# Patient Record
Sex: Female | Born: 1935 | Race: Black or African American | Hispanic: No | Smoking: Never smoker
Health system: Southern US, Community
[De-identification: ages and names within clinical notes are randomized; demographics above are authoritative.]

## PROBLEM LIST (undated history)

## (undated) DIAGNOSIS — S81809A Unspecified open wound, unspecified lower leg, initial encounter: Secondary | ICD-10-CM

## (undated) DIAGNOSIS — F0390 Unspecified dementia without behavioral disturbance: Secondary | ICD-10-CM

## (undated) DIAGNOSIS — K219 Gastro-esophageal reflux disease without esophagitis: Secondary | ICD-10-CM

---

## 2009-05-28 ENCOUNTER — Ambulatory Visit: Payer: Self-pay | Admitting: Internal Medicine

## 2011-01-19 ENCOUNTER — Encounter: Payer: Self-pay | Admitting: Nurse Practitioner

## 2011-01-19 ENCOUNTER — Encounter: Payer: Self-pay | Admitting: Cardiothoracic Surgery

## 2011-02-03 ENCOUNTER — Encounter: Payer: Self-pay | Admitting: Cardiothoracic Surgery

## 2011-02-03 ENCOUNTER — Encounter: Payer: Self-pay | Admitting: Nurse Practitioner

## 2011-03-06 ENCOUNTER — Encounter: Payer: Self-pay | Admitting: Nurse Practitioner

## 2011-03-06 ENCOUNTER — Encounter: Payer: Self-pay | Admitting: Cardiothoracic Surgery

## 2011-04-06 ENCOUNTER — Encounter: Payer: Self-pay | Admitting: Nurse Practitioner

## 2011-04-06 ENCOUNTER — Encounter: Payer: Self-pay | Admitting: Cardiothoracic Surgery

## 2011-05-04 ENCOUNTER — Encounter: Payer: Self-pay | Admitting: Nurse Practitioner

## 2011-05-04 ENCOUNTER — Encounter: Payer: Self-pay | Admitting: Cardiothoracic Surgery

## 2011-05-17 LAB — WOUND CULTURE

## 2011-06-04 ENCOUNTER — Encounter: Payer: Self-pay | Admitting: Cardiothoracic Surgery

## 2011-06-04 ENCOUNTER — Encounter: Payer: Self-pay | Admitting: Nurse Practitioner

## 2011-07-04 ENCOUNTER — Encounter: Payer: Self-pay | Admitting: Cardiothoracic Surgery

## 2011-07-04 ENCOUNTER — Encounter: Payer: Self-pay | Admitting: Nurse Practitioner

## 2011-08-04 ENCOUNTER — Encounter: Payer: Self-pay | Admitting: Cardiothoracic Surgery

## 2011-08-04 ENCOUNTER — Encounter: Payer: Self-pay | Admitting: Nurse Practitioner

## 2011-09-03 ENCOUNTER — Encounter: Payer: Self-pay | Admitting: Cardiothoracic Surgery

## 2011-09-03 ENCOUNTER — Encounter: Payer: Self-pay | Admitting: Nurse Practitioner

## 2011-09-23 LAB — WOUND CULTURE

## 2011-10-04 ENCOUNTER — Encounter: Payer: Self-pay | Admitting: Cardiothoracic Surgery

## 2011-10-04 ENCOUNTER — Encounter: Payer: Self-pay | Admitting: Nurse Practitioner

## 2012-04-24 ENCOUNTER — Encounter: Payer: Self-pay | Admitting: Cardiothoracic Surgery

## 2012-04-24 ENCOUNTER — Encounter: Payer: Self-pay | Admitting: Nurse Practitioner

## 2012-05-03 ENCOUNTER — Encounter: Payer: Self-pay | Admitting: Cardiothoracic Surgery

## 2012-05-03 ENCOUNTER — Encounter: Payer: Self-pay | Admitting: Nurse Practitioner

## 2012-05-11 LAB — WOUND CULTURE

## 2012-06-03 ENCOUNTER — Encounter: Payer: Self-pay | Admitting: Nurse Practitioner

## 2012-06-03 ENCOUNTER — Encounter: Payer: Self-pay | Admitting: Cardiothoracic Surgery

## 2012-06-29 LAB — WOUND CULTURE

## 2012-07-03 ENCOUNTER — Encounter: Payer: Self-pay | Admitting: Cardiothoracic Surgery

## 2012-07-03 ENCOUNTER — Encounter: Payer: Self-pay | Admitting: Nurse Practitioner

## 2012-08-03 ENCOUNTER — Encounter: Payer: Self-pay | Admitting: Cardiothoracic Surgery

## 2012-08-03 ENCOUNTER — Encounter: Payer: Self-pay | Admitting: Nurse Practitioner

## 2012-09-02 ENCOUNTER — Encounter: Payer: Self-pay | Admitting: Nurse Practitioner

## 2012-09-02 ENCOUNTER — Encounter: Payer: Self-pay | Admitting: Cardiothoracic Surgery

## 2012-10-03 ENCOUNTER — Encounter: Payer: Self-pay | Admitting: Nurse Practitioner

## 2012-10-03 ENCOUNTER — Encounter: Payer: Self-pay | Admitting: Cardiothoracic Surgery

## 2013-04-05 ENCOUNTER — Ambulatory Visit
Admit: 2013-04-05 | Disposition: A | Discharge status: Home / Self Care | Payer: Self-pay | Attending: Nurse Practitioner | Admitting: Nurse Practitioner

## 2013-07-03 ENCOUNTER — Ambulatory Visit: Payer: Self-pay | Admitting: Nurse Practitioner

## 2013-08-03 ENCOUNTER — Ambulatory Visit
Admit: 2013-08-03 | Disposition: A | Discharge status: Home / Self Care | Payer: Self-pay | Attending: Nurse Practitioner | Admitting: Nurse Practitioner

## 2013-09-02 ENCOUNTER — Ambulatory Visit
Admit: 2013-09-02 | Disposition: A | Discharge status: Home / Self Care | Payer: Self-pay | Attending: Nurse Practitioner | Admitting: Nurse Practitioner

## 2014-03-05 ENCOUNTER — Ambulatory Visit
Admit: 2014-03-05 | Disposition: A | Discharge status: Home / Self Care | Payer: Self-pay | Attending: Nurse Practitioner | Admitting: Nurse Practitioner

## 2014-10-14 ENCOUNTER — Encounter: Payer: Self-pay | Admitting: Internal Medicine

## 2014-10-14 ENCOUNTER — Inpatient Hospital Stay
Admission: EM | Admit: 2014-10-14 | Discharge: 2014-10-15 | DRG: 534 | Disposition: A | Discharge status: Skilled Nursing Facility | Payer: Medicare Other | Attending: Internal Medicine | Admitting: Internal Medicine

## 2014-10-14 ENCOUNTER — Emergency Department: Payer: Medicare Other

## 2014-10-14 DIAGNOSIS — X58XXXA Exposure to other specified factors, initial encounter: Secondary | ICD-10-CM | POA: Diagnosis present

## 2014-10-14 DIAGNOSIS — Z79899 Other long term (current) drug therapy: Secondary | ICD-10-CM | POA: Diagnosis not present

## 2014-10-14 DIAGNOSIS — S7292XA Unspecified fracture of left femur, initial encounter for closed fracture: Secondary | ICD-10-CM | POA: Diagnosis present

## 2014-10-14 DIAGNOSIS — K219 Gastro-esophageal reflux disease without esophagitis: Secondary | ICD-10-CM | POA: Diagnosis present

## 2014-10-14 DIAGNOSIS — S72452A Displaced supracondylar fracture without intracondylar extension of lower end of left femur, initial encounter for closed fracture: Secondary | ICD-10-CM | POA: Diagnosis present

## 2014-10-14 DIAGNOSIS — R739 Hyperglycemia, unspecified: Secondary | ICD-10-CM | POA: Diagnosis present

## 2014-10-14 DIAGNOSIS — Z8249 Family history of ischemic heart disease and other diseases of the circulatory system: Secondary | ICD-10-CM | POA: Diagnosis not present

## 2014-10-14 DIAGNOSIS — D72829 Elevated white blood cell count, unspecified: Secondary | ICD-10-CM | POA: Diagnosis present

## 2014-10-14 DIAGNOSIS — J986 Disorders of diaphragm: Secondary | ICD-10-CM | POA: Diagnosis present

## 2014-10-14 DIAGNOSIS — Z66 Do not resuscitate: Secondary | ICD-10-CM | POA: Diagnosis present

## 2014-10-14 DIAGNOSIS — Z888 Allergy status to other drugs, medicaments and biological substances status: Secondary | ICD-10-CM

## 2014-10-14 DIAGNOSIS — Z79891 Long term (current) use of opiate analgesic: Secondary | ICD-10-CM

## 2014-10-14 DIAGNOSIS — D649 Anemia, unspecified: Secondary | ICD-10-CM | POA: Diagnosis present

## 2014-10-14 DIAGNOSIS — F0390 Unspecified dementia without behavioral disturbance: Secondary | ICD-10-CM | POA: Diagnosis present

## 2014-10-14 DIAGNOSIS — R740 Nonspecific elevation of levels of transaminase and lactic acid dehydrogenase [LDH]: Secondary | ICD-10-CM | POA: Diagnosis present

## 2014-10-14 DIAGNOSIS — R7401 Elevation of levels of liver transaminase levels: Secondary | ICD-10-CM

## 2014-10-14 HISTORY — DX: Unspecified open wound, unspecified lower leg, initial encounter: S81.809A

## 2014-10-14 HISTORY — DX: Unspecified dementia, unspecified severity, without behavioral disturbance, psychotic disturbance, mood disturbance, and anxiety: F03.90

## 2014-10-14 HISTORY — DX: Gastro-esophageal reflux disease without esophagitis: K21.9

## 2014-10-14 LAB — CBC WITH DIFFERENTIAL/PLATELET
BASOS ABS: 0 10*3/uL (ref 0–0.1)
BASOS PCT: 0 %
EOS PCT: 1 %
Eosinophils Absolute: 0.1 10*3/uL (ref 0–0.7)
HCT: 34.2 % — ABNORMAL LOW (ref 35.0–47.0)
HEMOGLOBIN: 10.8 g/dL — AB (ref 12.0–16.0)
Lymphocytes Relative: 34 %
Lymphs Abs: 3.8 10*3/uL — ABNORMAL HIGH (ref 1.0–3.6)
MCH: 27.9 pg (ref 26.0–34.0)
MCHC: 31.7 g/dL — AB (ref 32.0–36.0)
MCV: 87.9 fL (ref 80.0–100.0)
MONO ABS: 0.6 10*3/uL (ref 0.2–0.9)
MONOS PCT: 5 %
Neutro Abs: 6.8 10*3/uL — ABNORMAL HIGH (ref 1.4–6.5)
Neutrophils Relative %: 60 %
Platelets: 358 10*3/uL (ref 150–440)
RBC: 3.89 MIL/uL (ref 3.80–5.20)
RDW: 14.4 % (ref 11.5–14.5)
WBC: 11.3 10*3/uL — ABNORMAL HIGH (ref 3.6–11.0)

## 2014-10-14 LAB — COMPREHENSIVE METABOLIC PANEL
ALK PHOS: 105 U/L (ref 38–126)
ALT: 62 U/L — ABNORMAL HIGH (ref 14–54)
AST: 63 U/L — AB (ref 15–41)
Albumin: 3.2 g/dL — ABNORMAL LOW (ref 3.5–5.0)
Anion gap: 7 (ref 5–15)
BUN: 28 mg/dL — AB (ref 6–20)
CHLORIDE: 102 mmol/L (ref 101–111)
CO2: 28 mmol/L (ref 22–32)
Calcium: 8.8 mg/dL — ABNORMAL LOW (ref 8.9–10.3)
Creatinine, Ser: 0.69 mg/dL (ref 0.44–1.00)
GFR calc Af Amer: >60 mL/min (ref >60)
GFR calc non Af Amer: >60 mL/min (ref >60)
Glucose, Bld: 186 mg/dL — ABNORMAL HIGH (ref 65–99)
POTASSIUM: 4.5 mmol/L (ref 3.5–5.1)
Sodium: 137 mmol/L (ref 135–145)
TOTAL PROTEIN: 6.7 g/dL (ref 6.5–8.1)
Total Bilirubin: 0.7 mg/dL (ref 0.3–1.2)

## 2014-10-14 MED ORDER — OXYCODONE HCL 5 MG PO TABS: 5.0000 mg | ORAL_TABLET | ORAL | Status: DC | PRN

## 2014-10-14 MED ORDER — MORPHINE SULFATE 4 MG/ML IJ SOLN
4.0000 mg | Freq: Once | INTRAMUSCULAR | Status: AC
  Administered 2014-10-14: 4 mg via INTRAVENOUS
  Filled 2014-10-14: qty 1

## 2014-10-14 MED ORDER — POLYETHYLENE GLYCOL 3350 17 G PO PACK
17.0000 g | PACK | Freq: Every day | ORAL | Status: DC
  Administered 2014-10-15: 17 g via ORAL
  Filled 2014-10-14: qty 1

## 2014-10-14 MED ORDER — ACETAMINOPHEN 650 MG RE SUPP: 650.0000 mg | Freq: Four times a day (QID) | RECTAL | Status: DC | PRN

## 2014-10-14 MED ORDER — HYDROCERIN EX CREA
1.0000 "application " | TOPICAL_CREAM | Freq: Every evening | CUTANEOUS | Status: DC
  Filled 2014-10-14: qty 113

## 2014-10-14 MED ORDER — DIVALPROEX SODIUM 125 MG PO CSDR
125.0000 mg | DELAYED_RELEASE_CAPSULE | Freq: Every day | ORAL | Status: DC
  Administered 2014-10-14: 125 mg via ORAL
  Filled 2014-10-14: qty 1

## 2014-10-14 MED ORDER — IPRATROPIUM-ALBUTEROL 0.5-2.5 (3) MG/3ML IN SOLN
3.0000 mL | Freq: Four times a day (QID) | RESPIRATORY_TRACT | Status: DC
  Administered 2014-10-14 – 2014-10-15 (×4): 3 mL via RESPIRATORY_TRACT
  Filled 2014-10-14 (×4): qty 3

## 2014-10-14 MED ORDER — DOCUSATE SODIUM 100 MG PO CAPS
100.0000 mg | ORAL_CAPSULE | Freq: Two times a day (BID) | ORAL | Status: DC
  Administered 2014-10-14 – 2014-10-15 (×2): 100 mg via ORAL
  Filled 2014-10-14 (×2): qty 1

## 2014-10-14 MED ORDER — PANTOPRAZOLE SODIUM 40 MG PO TBEC
40.0000 mg | DELAYED_RELEASE_TABLET | Freq: Every day | ORAL | Status: DC
  Administered 2014-10-15: 40 mg via ORAL
  Filled 2014-10-14: qty 1

## 2014-10-14 MED ORDER — GABAPENTIN 300 MG PO CAPS
300.0000 mg | ORAL_CAPSULE | Freq: Every day | ORAL | Status: DC
  Administered 2014-10-14: 300 mg via ORAL
  Filled 2014-10-14: qty 1

## 2014-10-14 MED ORDER — ENOXAPARIN SODIUM 40 MG/0.4ML ~~LOC~~ SOLN: 40.0000 mg | SUBCUTANEOUS | Status: DC

## 2014-10-14 MED ORDER — ACETAMINOPHEN 325 MG PO TABS
650.0000 mg | ORAL_TABLET | Freq: Four times a day (QID) | ORAL | Status: DC | PRN
  Administered 2014-10-14 – 2014-10-15 (×3): 650 mg via ORAL
  Filled 2014-10-14 (×3): qty 2

## 2014-10-14 NOTE — ED Provider Notes (Signed)
[facility] Emergency Department Provider Note     Time seen: ----------------------------------------- 2:41 PM on 10/14/2014 -----------------------------------------    I have reviewed the triage vital signs and the nursing notes. L5 caveat: Review of systems and history is difficult to obtain due to dementia  HISTORY  Chief Complaint Hip Injury    HPI DIMA MINI is a 81 y.o. female brought in by EMS from [facility] for possible distal femur fracture. She reportedly had an outpatient x-ray which showed a distal femur fracture, nursing home states she has not fallen, there was no trauma. Patient denies falling, just complains of lower leg pain. According to reports she does not ambulate.   No past medical history on file.  There are no active problems to display for this patient.   No past surgical history on file.  Allergies Diamox sequels  Social History Social History  Substance Use Topics  . Smoking status: Not on file  . Smokeless tobacco: Not on file  . Alcohol Use: Not on file    Review of Systems Musculoskeletal: Positive for left leg pain  Review of systems otherwise unknown  ____________________________________________   PHYSICAL EXAM:  VITAL SIGNS: ED Triage Vitals  Enc Vitals Group     BP 10/14/14 1429 140/85 mmHg     Pulse Rate 10/14/14 1429 95     Resp --      Temp --      Temp src --      SpO2 10/14/14 1429 95 %     Weight 10/14/14 1429 150 lb (68.04 kg)     Height 10/14/14 1429 5\' 6"  (1.676 m)     Head Cir --      Peak Flow --      Pain Score --      Pain Loc --      Pain Edu? --      Excl. in GC? --     Constitutional: Alert but disoriented, she appears to be uncomfortable Eyes: Conjunctivae are normal. PERRL. Normal extraocular movements. ENT   Head: Normocephalic and atraumatic.   Nose: No congestion/rhinnorhea.   Mouth/Throat: Mucous membranes are moist.   Neck: No  stridor. Cardiovascular: Normal rate, regular rhythm. Normal and symmetric distal pulses are present in all extremities. Systolic murmur Respiratory: Normal respiratory effort without tachypnea nor retractions. Breath sounds are clear and equal bilaterally. No wheezes/rales/rhonchi. Gastrointestinal: Soft and nontender. No distention. No abdominal bruits. Bowel sounds radiating into the chest, likely hiatal hernia Musculoskeletal: Severe pain with any range of motion of left lower extremity, distal pulses are intact Skin:  Skin is warm, dry and intact. No rash noted. ____________________________________________  ED COURSE:  Pertinent labs & imaging results that were available during my care of the patient were reviewed by me and considered in my medical decision making (see chart for details). Will need reimaging of the left femur, basic labs. ____________________________________________    LABS (pertinent positives/negatives)  Labs Reviewed  CBC WITH DIFFERENTIAL/PLATELET  COMPREHENSIVE METABOLIC PANEL    RADIOLOGY Images were viewed by me  Left femur x-ray reveals a comminuted 100% displaced distal femur fracture IMPRESSION: 1. Severely comminuted, displaced and foreshortened fracture of the distal third of the femoral diaphysis. 2. Mildly impacted supracondylar fracture of the distal femur, with potential intra-articular extension. 3. Unusual lucencies over the proximal femoral diaphysis and intertrochanteric region, favored to be artifactual. 4. Given the findings in numbers 1-3 above, further evaluation with CT of the entire left femur  is suggested at this time to ensure that there is a full depiction of the extent of trauma to the left femur. ____________________________________________  FINAL ASSESSMENT AND PLAN  Distal femur fracture  Plan: Patient with labs and imaging as dictated above. Patient is nonambulatory, however will need orthopedics and pain control. Dr.  Rosita Kea has seen the patient in the ER. Case is discussed with the hospitalist.   Emily Filbert, MD   Emily Filbert, MD 10/14/14 1600

## 2014-10-14 NOTE — ED Notes (Signed)
Pt has dnr from outside facility signed by dr. Maryellen Pile but rest of document not completed.

## 2014-10-14 NOTE — ED Notes (Signed)
Per nursing home the 3rd shift nurse from last night noted increased swelling and pain in pt's left leg. No injury per staff - pt is nonambulatory and requires a max assist into a chair. Left leg/knee swollen and pt requested I not touch it. States that is the only time it hurts.

## 2014-10-14 NOTE — ED Notes (Signed)
Assisted ortho dr into placing a knee immobilizer on pt

## 2014-10-14 NOTE — ED Notes (Signed)
Pt received roxicet 1 tab at 1pm for pain - currently denies pain.

## 2014-10-14 NOTE — ED Notes (Signed)
Pt currently denies pain - let her know she had pain meds if she needed

## 2014-10-14 NOTE — ED Notes (Signed)
Brought in by ems from  health care for left distal femur fx diagnosed thru xr today. Call out to nh to find out details

## 2014-10-14 NOTE — H&P (Signed)
[facility] [place] [facility]   PATIENT NAME: Edit Christina Macdonald    MR#:  [id number]  DATE OF BIRTH:  Oct 06, 1924  DATE OF ADMISSION:  10/14/2014  PRIMARY CARE PHYSICIAN: Derwood Kaplan, MD   REQUESTING/REFERRING PHYSICIAN: Cherene Julian M.D.  CHIEF COMPLAINT:   Chief Complaint  Patient presents with  . Hip Injury    left distal femur fx    HISTORY OF PRESENT ILLNESS:  Christina Macdonald  is a 81 y.o. female with a known history of dementia. She is currently at [facility]. The patient has dementia, so she is not a good historian.  The patient cannot recall any trauma to the leg or any falls. As per the son she was complaining of some pain to him about 2 days ago. As per the family the facility did not report to them of any falls or any trauma. The son noticed that oxygen was on for a few days for shortness of breath. The patient has not had any attempt to walk in about 6 months with her dementia and knee problems. She was found to have a left distal femur fracture and hospitalist services were contacted for further evaluation.  PAST MEDICAL HISTORY:   Past Medical History  Diagnosis Date  . Dementia   . GERD (gastroesophageal reflux disease)   . Wounds, multiple open, lower extremity     PAST SURGICAL HISTORY:  History reviewed. No pertinent past surgical history.  SOCIAL HISTORY:   Social History  Substance Use Topics  . Smoking status: Never Smoker   . Smokeless tobacco: Not on file  . Alcohol Use: No    FAMILY HISTORY:   Family History  Problem Relation Age of Onset  . CAD Father     DRUG ALLERGIES:   Allergies  Allergen Reactions  . Diamox Sequels [Acetazolamide Er] Other (See Comments)    Reaction:  Unknown     REVIEW OF SYSTEMS:  CONSTITUTIONAL: No fever, positive for weakness.  EYES: No blurred or double vision. Wears glasses. EARS, NOSE, AND THROAT: No tinnitus or ear pain. Occasional sore throat. RESPIRATORY:  No cough, positive for shortness of breath, no wheezing or hemoptysis.  CARDIOVASCULAR: No chest pain, orthopnea, edema.  GASTROINTESTINAL: No nausea, vomiting, or diarrhea. Occasional abdominal pain. No blood in bowel movements. GENITOURINARY: No dysuria, hematuria.  ENDOCRINE: No polyuria, nocturia,  HEMATOLOGY: No anemia, easy bruising or bleeding SKIN: No rash or lesion. MUSCULOSKELETAL: Left leg pain and bilateral knee pain  NEUROLOGIC: No tingling, numbness, weakness.  PSYCHIATRY: No anxiety or depression.   MEDICATIONS AT HOME:   Prior to Admission medications   Medication Sig Start Date End Date Taking? Authorizing Provider  acetaminophen (TYLENOL) 500 MG tablet Take 1,000 mg by mouth 3 (three) times daily.   Yes Historical Provider, MD  cholecalciferol (VITAMIN D) 1000 UNITS tablet Take 1,000 Units by mouth daily.   Yes Historical Provider, MD  divalproex (DEPAKOTE SPRINKLE) 125 MG capsule Take 125 mg by mouth at bedtime.   Yes Historical Provider, MD  docusate sodium (COLACE) 100 MG capsule Take 100 mg by mouth 2 (two) times daily.   Yes Historical Provider, MD  gabapentin (NEURONTIN) 300 MG capsule Take 300 mg by mouth at bedtime.   Yes Historical Provider, MD  hydrophilic ointment Apply 1 application topically every evening. Pt applies to feet.   Yes Historical Provider, MD  ipratropium-albuterol (DUONEB) 0.5-2.5 (3) MG/3ML SOLN Take 3 mLs by nebulization 4 (four) times daily.  Yes Historical Provider, MD  omeprazole (PRILOSEC) 20 MG capsule Take 20 mg by mouth daily.   Yes Historical Provider, MD  oxyCODONE (OXY IR/ROXICODONE) 5 MG immediate release tablet Take 5 mg by mouth every 4 (four) hours as needed for severe pain.   Yes Historical Provider, MD  polyethylene glycol (MIRALAX / GLYCOLAX) packet Take 17 g by mouth daily.   Yes Historical Provider, MD      VITAL SIGNS:  Blood pressure 135/80, pulse 81, temperature 98.1 F (36.7 C), temperature source Oral, resp. rate  16, height  (1.676 m), weight 68.04 kg (150 lb), SpO2 98 %.  PHYSICAL EXAMINATION:  GENERAL:  81 y.o.-year-old patient lying in the bed with no acute distress.  EYES: Pupils equal, round, reactive to light and accommodation. No scleral icterus. Extraocular muscles intact.  HEENT: Head atraumatic, normocephalic. Oropharynx and nasopharynx clear.  NECK:  Supple, no jugular venous distention. No thyroid enlargement, no tenderness.  LUNGS: Normal breath sounds bilaterally, no wheezing, rales,rhonchi or crepitation. No use of accessory muscles of respiration.  CARDIOVASCULAR: S1, S2 normal. 26 systolic murmur, no rubs, or gallops.  ABDOMEN: Soft, nontender, nondistended. Bowel sounds present. No organomegaly or mass.  EXTREMITIES: Trace edema, no cyanosis, or clubbing. Left leg currently in immobilizer. NEUROLOGIC: Difficult to test cranial nerves with history of dementia. Extraocular muscles are intact. Patient able to move her arms on her own. Patient is able to move her left foot. PSYCHIATRIC: The patient is alert. SKIN: Bilateral feet has areas of healing wounds on the right lateral ankle and left medial foot. No signs of infection  LABORATORY PANEL:   CBC  Recent Labs Lab 10/14/14 1600  WBC 11.3*  HGB 10.8*  HCT 34.2*  PLT 358    Chemistries   Recent Labs Lab 10/14/14 1600  NA 137  K 4.5  CL 102  CO2 28  GLUCOSE 186*  BUN 28*  CREATININE 0.69  CALCIUM 8.8*  AST 63*  ALT 62*  ALKPHOS 105  BILITOT 0.7     RADIOLOGY:  Dg Chest 1 View  10/14/2014   CLINICAL DATA:  Leg pain after fall.  EXAM: CHEST  1 VIEW  COMPARISON:  None.  FINDINGS: The heart size and mediastinal contours are within normal limits. No pneumothorax or pleural effusion is noted. Severely elevated right hemidiaphragm is noted. Both lungs are clear. Degenerative change of right glenohumeral joint is noted.  IMPRESSION: Severely elevated right hemidiaphragm is noted concerning for diaphragmatic  paralysis. No other abnormality seen.   Electronically Signed   By: Lupita Raider, M.D.   On: 10/14/2014 15:28   Dg Femur Min 2 Views Left  10/14/2014   CLINICAL DATA:  81 year old female with history of trauma from a fall today complaining of left leg pain distally.  EXAM: LEFT FEMUR 2 VIEWS  COMPARISON:  No priors.  FINDINGS: Severely comminuted displaced and foreshortened fracture of the distal third of the femoral diaphysis is noted. There is approximately 1/2 shaft width of posterior displacement, and several cm of proximal migration of the distal fracture fragment. In addition, there is a poorly demonstrated supracondylar fracture, which appears mildly impacted, and there is a suggestion that this may extend to the joint space in the intercondylar notch. Advanced degenerative changes of osteoarthritis are noted in the knee joint. Although no definite fracture of the proximal left femur is identified, there are some unusual lucencies overlying the proximal diaphysis and intertrochanteric region, which are favored to be related to overlying  skin folds. Femoral head appears located.  IMPRESSION: 1. Severely comminuted, displaced and foreshortened fracture of the distal third of the femoral diaphysis. 2. Mildly impacted supracondylar fracture of the distal femur, with potential intra-articular extension. 3. Unusual lucencies over the proximal femoral diaphysis and intertrochanteric region, favored to be artifactual. 4. Given the findings in numbers 1-3 above, further evaluation with CT of the entire left femur is suggested at this time to ensure that there is a full depiction of the extent of trauma to the left femur.   Electronically Signed   By: Trudie Reed M.D.   On: 10/14/2014 15:35    EKG:   Normal sinus rhythm 82 bpm incomplete right bundle branch block, left anterior fascicular block, nonspecific ST-T wave changes  IMPRESSION AND PLAN:   1. Distal left femur fracture closed with  malalignment. Orthopedic surgery consultation. If surgery decided on the patient is a moderate risk. They may decide on conservative management and pain control. Continue oxycodone when necessary pain 2 dementia without behavioral disturbance- continue usual psychiatric medications. 3. Lower extremity healing wounds 4. Gastroesophageal reflux disease without esophagitis continue PPI  All the records are reviewed and case discussed with ED provider. Management plans discussed with the patient, family and they are in agreement.  CODE STATUS: DO NOT RESUSCITATE  TOTAL TIME TAKING CARE OF THIS PATIENT: 55 minutes.    Alford Highland M.D on 10/14/2014 at 5:23 PM  Between 7am to 6pm - Pager - [phone]  After 6pm call admission pager [phone]  [facility]  Office  [phone]  CC: Primary care physician; Derwood Kaplan, MD

## 2014-10-14 NOTE — Consult Note (Addendum)
Comminuted distal femur in nonambulatory confused woman.. Discussed with son.  Recommend trial of non operative treatment  On exam patient is noted to have plantar flexion deformities of both feet consistent with her inability to ambulate. There is some crepitation with application of the knee immobilizer with moderate pain, but after discussion with the son he understands that operative intervention would be fairly risky and there is significant chance that we may not have adequate fixation with her poor bone quality and couldn't of having loss of fixation requiring additional surgery that is not likely to benefit her much. Can be managed adequately with the immobilizer stay in appropriate position hope that she will heal the current position

## 2014-10-15 DIAGNOSIS — R7401 Elevation of levels of liver transaminase levels: Secondary | ICD-10-CM

## 2014-10-15 DIAGNOSIS — D72829 Elevated white blood cell count, unspecified: Secondary | ICD-10-CM

## 2014-10-15 DIAGNOSIS — D649 Anemia, unspecified: Secondary | ICD-10-CM

## 2014-10-15 DIAGNOSIS — R739 Hyperglycemia, unspecified: Secondary | ICD-10-CM

## 2014-10-15 DIAGNOSIS — R740 Nonspecific elevation of levels of transaminase and lactic acid dehydrogenase [LDH]: Secondary | ICD-10-CM

## 2014-10-15 DIAGNOSIS — J986 Disorders of diaphragm: Secondary | ICD-10-CM

## 2014-10-15 LAB — BASIC METABOLIC PANEL
Anion gap: 5 (ref 5–15)
BUN: 23 mg/dL — AB (ref 6–20)
CALCIUM: 8.5 mg/dL — AB (ref 8.9–10.3)
CO2: 28 mmol/L (ref 22–32)
Chloride: 107 mmol/L (ref 101–111)
Creatinine, Ser: 0.61 mg/dL (ref 0.44–1.00)
GFR calc Af Amer: >60 mL/min (ref >60)
GFR calc non Af Amer: >60 mL/min (ref >60)
GLUCOSE: 92 mg/dL (ref 65–99)
Potassium: 4.7 mmol/L (ref 3.5–5.1)
Sodium: 140 mmol/L (ref 135–145)

## 2014-10-15 LAB — CBC
HEMATOCRIT: 31.4 % — AB (ref 35.0–47.0)
Hemoglobin: 10.1 g/dL — ABNORMAL LOW (ref 12.0–16.0)
MCH: 27.9 pg (ref 26.0–34.0)
MCHC: 32.1 g/dL (ref 32.0–36.0)
MCV: 87 fL (ref 80.0–100.0)
Platelets: 327 10*3/uL (ref 150–440)
RBC: 3.6 MIL/uL — ABNORMAL LOW (ref 3.80–5.20)
RDW: 14.3 % (ref 11.5–14.5)
WBC: 10 10*3/uL (ref 3.6–11.0)

## 2014-10-15 LAB — HEMOGLOBIN A1C: HEMOGLOBIN A1C: 5.7 % (ref 4.0–6.0)

## 2014-10-15 MED ORDER — IBUPROFEN 200 MG PO TABS: 200.0000 mg | ORAL_TABLET | Freq: Four times a day (QID) | ORAL | Status: AC | PRN

## 2014-10-15 NOTE — Progress Notes (Signed)
Subjective :Patient is day #1 comminuted fracture distal left femur Patient reports pain as mild to moderate.   no nausea and no vomiting Patient denies any chest pains or shortness of breath. Patient resting well   Objective: Vital signs in last 24 hours: Temp:  [97.4 F (36.3 C)-98.1 F (36.7 C)] 97.9 F (36.6 C) (08/12 0417) Pulse Rate:  [81-95] 90 (08/12 0417) Resp:  [16-18] 18 (08/12 0417) BP: (111-186)/(58-94) 111/60 mmHg (08/12 0417) SpO2:  [93 %-100 %] 98 % (08/12 0417) Weight:  [62.738 kg (138 lb 5 oz)-68.04 kg (150 lb)] 62.738 kg (138 lb 5 oz) (08/11 1746) Wound check No surgical incision no sensory deficits noted  Some swelling distally to the left thigh. Knee immobilizer in place. Heel boots in place.  Intake/Output from previous day: 08/11 0701 - 08/12 0700 In: 240 [P.O.:240] Out: -  Intake/Output this shift:     Recent Labs  10/14/14 1600 10/15/14 0452  HGB 10.8* 10.1*    Recent Labs  10/14/14 1600 10/15/14 0452  WBC 11.3* 10.0  RBC 3.89 3.60*  HCT 34.2* 31.4*  PLT 358 327    Recent Labs  10/14/14 1600 10/15/14 0452  NA 137 140  K 4.5 4.7  CL 102 107  CO2 28 28  BUN 28* 23*  CREATININE 0.69 0.61  GLUCOSE 186* 92  CALCIUM 8.8* 8.5*   No results for input(s): LABPT, INR in the last 72 hours.  Neurologically intact Sensation intact distally  Assessment/Plan: We'll continue to treat non-surgical at this time. Knee immobilizer is to be on at all times. Please open knee immobilizer to check skin once or twice a day. Case management to assist with discharge planning Physical therapy today Bowel movement today Plan to discharge Sunday or Monday if medically cleared We will need to follow-up in [facility] 4 weeks. Sooner if any problems   WOLFE,JON R. 10/15/2014, 7:41 AM

## 2014-10-15 NOTE — Discharge Summary (Signed)
[facility] [place] at [facility]   PATIENT NAME: Christina Macdonald    MR#:  [id number]  DATE OF BIRTH:  10-07-1924  DATE OF ADMISSION:  10/14/2014 ADMITTING PHYSICIAN: Alford Highland, MD  DATE OF DISCHARGE: No discharge date for patient encounter.  PRIMARY CARE PHYSICIAN: Derwood Kaplan, MD     ADMISSION DIAGNOSIS:  Femur fracture, left, closed, initial encounter [S72.92XA]  DISCHARGE DIAGNOSIS:  Principal Problem:   Femur fracture, left Active Problems:   Elevated transaminase level   Hyperglycemia   Leukocytosis   Anemia   Diaphragmatic paralysis   SECONDARY DIAGNOSIS:   Past Medical History  Diagnosis Date  . Dementia   . GERD (gastroesophageal reflux disease)   . Wounds, multiple open, lower extremity     .pro HOSPITAL COURSE:   patient is a 81 year old African-American female with history of dementia who presents to the hospital with complaints of complains of left leg pain for about 2 days. No fall or trauma reported. On arrival to emergency room, patient's x-ray revealed severely comminuted displaced and foreshortened fracture of distal third of femoral diaphysis, mildly impacted supracondylar fracture of distal femur with potential intra-articular extension, some lucencies over proximal to femoral diaphysis and intertrochanteric region, which was favored to be artifactual. CT scan of entire femur was suggested. Patient was seen by Dr. Rosita Kea and did not recommend any further evaluation, but trial of nonoperative therapy was recommended. Patient denies any significant discomfort in her leg at present. She is receiving Tylenol for pain. Patient's labs revealed elevated liver enzymes, AST, as well as ALT 63 and 62 respectively. No elevated. Alkaline phosphatase was noted. Patient denies any abdominal pain, although her oral intake is poor Discussion by problem 1. Closed fracture of distal and left femur. Subsequent encounter , recommend pain  control with Advil, oxycodone , but not Tylenol as I'm concerned about liver abnormalities, unless used less than 3 g a day,  discharge patient back to skilled nursing facility with follow-up with Dr. Rosita Kea as outpatient. Patient may benefit from CT scan of femur as outpatient as recommended by radiologist. 2. Elevated transaminases of unclear etiology. Patient has no pains. The patient is to be investigated as outpatient with hepatitis panel,  Do not use high doses of Tylenol for pain control . 3. Hyperglycemia. Get hemoglobin A1c to rule out diabetes mellitus 4. Leukocytosis, resolved with conservative therapy, likely stress related 5. Anemia. Follow with therapy, may need iron supplementation at home, get iron level 6. 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 Patient is relatively asymptomatic with oxygen saturations in the high 90s on room air. Oxygen as needed 7. Femur lucencies on x-ray, alkaline phosphatase level is normal. Needs to have CT scan as outpatient DISCHARGE CONDITIONS:   Stable  CONSULTS OBTAINED:  Treatment Team:  Kennedy Bucker, MD  DRUG ALLERGIES:   Allergies  Allergen Reactions  . Diamox Sequels [Acetazolamide Er] Other (See Comments)    Reaction:  Unknown     DISCHARGE MEDICATIONS:   Current Discharge Medication List    START taking these medications   Details  ibuprofen (ADVIL) 200 MG tablet Take 1 tablet (200 mg total) by mouth every 6 (six) hours as needed. Qty: 30 tablet, Refills: 0      CONTINUE these medications which have NOT CHANGED   Details  cholecalciferol (VITAMIN D) 1000 UNITS tablet Take 1,000 Units by mouth daily.    divalproex (DEPAKOTE SPRINKLE) 125 MG capsule Take 125 mg by mouth  at bedtime.    docusate sodium (COLACE) 100 MG capsule Take 100 mg by mouth 2 (two) times daily.    gabapentin (NEURONTIN) 300 MG capsule Take 300 mg by mouth at bedtime.    hydrophilic ointment Apply 1 application topically every evening.  Pt applies to feet.    ipratropium-albuterol (DUONEB) 0.5-2.5 (3) MG/3ML SOLN Take 3 mLs by nebulization 4 (four) times daily.    omeprazole (PRILOSEC) 20 MG capsule Take 20 mg by mouth daily.    oxyCODONE (OXY IR/ROXICODONE) 5 MG immediate release tablet Take 5 mg by mouth every 4 (four) hours as needed for severe pain.    polyethylene glycol (MIRALAX / GLYCOLAX) packet Take 17 g by mouth daily.      STOP taking these medications     acetaminophen (TYLENOL) 500 MG tablet          DISCHARGE INSTRUCTIONS:    Patient is to follow-up with her primary care physician, Dr. Maryellen Pile, orthopedist Dr. Rosita Kea as outpatient  If you experience worsening of your admission symptoms, develop shortness of breath, life threatening emergency, suicidal or homicidal thoughts you must seek medical attention immediately by calling 911 or calling your MD immediately  if symptoms less severe.  You Must read complete instructions/literature along with all the possible adverse reactions/side effects for all the Medicines you take and that have been prescribed to you. Take any new Medicines after you have completely understood and accept all the possible adverse reactions/side effects.   Please note  You were cared for by a hospitalist during your hospital stay. If you have any questions about your discharge medications or the care you received while you were in the hospital after you are discharged, you can call the unit and asked to speak with the hospitalist on call if the hospitalist that took care of you is not available. Once you are discharged, your primary care physician will handle any further medical issues. Please note that NO REFILLS for any discharge medications will be authorized once you are discharged, as it is imperative that you return to your primary care physician (or establish a relationship with a primary care physician if you do not have one) for your aftercare needs so that they can reassess  your need for medications and monitor your lab values.    Today   CHIEF COMPLAINT:   Chief Complaint  Patient presents with  . Hip Injury    left distal femur fx    HISTORY OF PRESENT ILLNESS:  Christina Macdonald  is a 81 y.o. female with a known history of dementia who presents to the hospital with complaints of complains of left leg pain for about 2 days. No fall or trauma reported. On arrival to emergency room, patient's x-ray revealed severely comminuted displaced and foreshortened fracture of distal third of femoral diaphysis, mildly impacted supracondylar fracture of distal femur with potential intra-articular extension, some lucencies over proximal to femoral diaphysis and intertrochanteric region, which was favored to be artifactual. CT scan of entire femur was suggested. Patient was seen by Dr. Rosita Kea and did not recommend any further evaluation, but trial of nonoperative therapy was recommended. Patient denies any significant discomfort in her leg at present. She is receiving Tylenol for pain. Patient's labs revealed elevated liver enzymes, AST, as well as ALT 63 and 62 respectively. No elevated. Alkaline phosphatase was noted. Patient denies any abdominal pain, although her oral intake is poor Discussion by problem 1. Closed fracture of distal and left femur. Subsequent  encounter , recommend pain control with Advil, oxycodone , but not Tylenol as I'm concerned about liver abnormalities, unless used less than 3 g a day,  discharge patient back to skilled nursing facility with follow-up with Dr. Rosita Kea as outpatient. Patient may benefit from CT scan of femur as outpatient as recommended by radiologist. 2. Elevated transaminases of unclear etiology. Patient has no pains. The patient is to be investigated as outpatient with hepatitis panel,  Do not use high doses of Tylenol for pain control . 3. Hyperglycemia. Get hemoglobin A1c to rule out diabetes mellitus 4. Leukocytosis, resolved with  conservative therapy, likely stress related 5. Anemia. Follow with therapy, may need iron supplementation at home, get iron level 6. 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 Patient is relatively asymptomatic with oxygen saturations in the high 90s on room air. Oxygen as needed 7. Femur lucencies on x-ray, alkaline phosphatase level is normal. Needs to have CT scan as outpatient   VITAL SIGNS:  Blood pressure 127/64, pulse 83, temperature 98.5 F (36.9 C), temperature source Oral, resp. rate 15, height 5\' 5"  (1.651 m), weight 62.738 kg (138 lb 5 oz), SpO2 98 %.  I/O:   Intake/Output Summary (Last 24 hours) at 10/15/14 1153 Last data filed at 10/15/14 1040  Gross per 24 hour  Intake    360 ml  Output      0 ml  Net    360 ml    PHYSICAL EXAMINATION:  GENERAL: 81 y.o.-year-old patient lying in the bed with no acute distress. Comfortable in the bed EYES: Pupils equal, round, reactive to light and accommodation. No scleral icterus. Extraocular muscles intact.  HEENT: Head atraumatic, normocephalic. Oropharynx and nasopharynx clear.  NECK: Supple, no jugular venous distention. No thyroid enlargement, no tenderness.  LUNGS: Normal breath sounds bilaterally, no wheezing, rales,rhonchi or crepitation. No use of accessory muscles of respiration.  CARDIOVASCULAR: S1, S2 normal. No murmurs, rubs, or gallops.  ABDOMEN: Soft, nontender, nondistended. Bowel sounds present. No organomegaly or mass.  EXTREMITIES: No pedal edema, cyanosis, or clubbing. Patient's left lower extremity is in the immobilizer NEUROLOGIC: Cranial nerves II through XII are intact. Muscle strength 5/5 in all 3 extremities, not checked, and left lower extremity. Sensation grossly intact. Gait not checked.  PSYCHIATRIC: The patient is alert but confused and she is does not know where she is and is not sure if she ate breakfast, I told her that her femur was broken and a few minutes later she does not  remember that.  SKIN: No obvious rash, lesion, or ulcer.  DATA REVIEW:   CBC  Recent Labs Lab 10/15/14 0452  WBC 10.0  HGB 10.1*  HCT 31.4*  PLT 327    Chemistries   Recent Labs Lab 10/14/14 1600 10/15/14 0452  NA 137 140  K 4.5 4.7  CL 102 107  CO2 28 28  GLUCOSE 186* 92  BUN 28* 23*  CREATININE 0.69 0.61  CALCIUM 8.8* 8.5*  AST 63*  --   ALT 62*  --   ALKPHOS 105  --   BILITOT 0.7  --     Cardiac Enzymes No results for input(s): TROPONINI in the last 168 hours.  Microbiology Results  Results for orders placed or performed in visit on 06/03/12  Wound culture     Status: None   Collection Time: 06/25/12  1:44 PM  Result Value Ref Range Status   Micro Text Report   Final       SOURCE:  RIGHT LATERAL ANKLE    ORGANISM 1                LIGHT GROWTH STAPHYLOCOCCUS AUREUS   ORGANISM 2                LIGHT GROWTH MORGANELLA MORGANII SSP MORGANII   ORGANISM 3                LIGHT GROWTH GROUP G BETA STREPTOCOCCUS   ORGANISM 4                RARE PROTEUS MIRABILIS   COMMENT                   NO ANAEROBES ISOLATED IN 4 DAYS   GRAM STAIN                RARE WHITE BLOOD CELLS   GRAM STAIN                RARE GRAM POSITIVE COCCI   GRAM STAIN                RARE GRAM NEGATIVE ROD   ANTIBIOTIC                    ORG#1    ORG#2    ORG#3    ORG#4     CIPROFLOXACIN                 S        S                 S         CLINDAMYCIN                   R                                    ERYTHROMYCIN                  R                                    GENTAMICIN                    S        S                 S         LEVOFLOXACIN                  S        S                 S         LINEZOLID                     S                                    OXACILL IN                     S  TIGECYCLINE                   S                                    CEFOXITIN SCREEN              NEGATIVE                             INDUCIBLE CLINDAMYCIN  RESISTANPOSITIVE                             TRIMETHOPRIM/SULFAMETHOXAZOLE S        S                 S         AMPICILLIN                             R                 S         CEFAZOLIN                              R                 S         CEFOXITIN                              S                 S         CEFTAZIDIME                            S                 S         CEFTRIAXONE                            S                 S         IMIPENEM                               S                 S             RADIOLOGY:  Dg Chest 1 View  10/14/2014   CLINICAL DATA:  Leg pain after fall.  EXAM: CHEST  1 VIEW  COMPARISON:  None.  FINDINGS: The heart size and mediastinal contours are within normal limits. No pneumothorax or pleural effusion is noted. Severely elevated right hemidiaphragm is noted. Both lungs are clear. Degenerative change of right glenohumeral joint is noted.  IMPRESSION: Severely elevated right hemidiaphragm is noted concerning for diaphragmatic paralysis. No other abnormality seen.   Electronically Signed   By: Lupita Raider, M.D.   On: 10/14/2014 15:28  Dg Femur Min 2 Views Left  10/14/2014   CLINICAL DATA:  81 year old female with history of trauma from a fall today complaining of left leg pain distally.  EXAM: LEFT FEMUR 2 VIEWS  COMPARISON:  No priors.  FINDINGS: Severely comminuted displaced and foreshortened fracture of the distal third of the femoral diaphysis is noted. There is approximately 1/2 shaft width of posterior displacement, and several cm of proximal migration of the distal fracture fragment. In addition, there is a poorly demonstrated supracondylar fracture, which appears mildly impacted, and there is a suggestion that this may extend to the joint space in the intercondylar notch. Advanced degenerative changes of osteoarthritis are noted in the knee joint. Although no definite fracture of the proximal left femur is identified, there are some unusual lucencies  overlying the proximal diaphysis and intertrochanteric region, which are favored to be related to overlying skin folds. Femoral head appears located.  IMPRESSION: 1. Severely comminuted, displaced and foreshortened fracture of the distal third of the femoral diaphysis. 2. Mildly impacted supracondylar fracture of the distal femur, with potential intra-articular extension. 3. Unusual lucencies over the proximal femoral diaphysis and intertrochanteric region, favored to be artifactual. 4. Given the findings in numbers 1-3 above, further evaluation with CT of the entire left femur is suggested at this time to ensure that there is a full depiction of the extent of trauma to the left femur.   Electronically Signed   By: Trudie Reed M.D.   On: 10/14/2014 15:35    EKG:   Orders placed or performed during the hospital encounter of 10/14/14  . ED EKG  . ED EKG  . EKG 12-Lead  . EKG 12-Lead      Management plans discussed with the patient, family and they are in agreement.  CODE STATUS:     Code Status Orders        Start     Ordered   10/14/14 1717  Do not attempt resuscitation (DNR)   Continuous    Question Answer Comment  In the event of cardiac or respiratory ARREST Do not call a "code blue"   In the event of cardiac or respiratory ARREST Do not perform Intubation, CPR, defibrillation or ACLS   In the event of cardiac or respiratory ARREST Use medication by any route, position, wound care, and other measures to relive pain and suffering. May use oxygen, suction and manual treatment of airway obstruction as needed for comfort.   Comments Nurse may pronounce      10/14/14 1717    Advance Directive Documentation        Most Recent Value   Type of Advance Directive  Out of facility DNR (pink MOST or yellow form)   Pre-existing out of facility DNR order (yellow form or pink MOST form)     "MOST" Form in Place?        TOTAL TIME TAKING CARE OF THIS PATIENT: 40 minutes.     Katharina Caper M.D on 10/15/2014 at 11:53 AM  Between 7am to 6pm - Pager - [phone]  After 6pm go to [email or web address] - password [email or web address] [facility]  [place] [facility]  Office  [phone]  CC: Primary care physician; Derwood Kaplan, MD

## 2014-10-15 NOTE — Progress Notes (Signed)
Spoke with Kathlene November, [facility] rep at [phone], to notify of non-emergent EMS transport.  Auth notification reference given as [id number].   Service date range good from 10/15/14 - 01/13/15.   Gap exception requested to determine if services can be considered at an in-network level.

## 2014-10-15 NOTE — Plan of Care (Signed)
Problem: Phase I Progression Outcomes Goal: Pre op pain controlled with appropriate interventions Outcome: Progressing Pt pain controled with oral pain medication Goal: Pre op Medical MD consult, if indicated Outcome: Completed/Met Date Met:  10/15/14 MD saw pt.

## 2014-10-15 NOTE — Progress Notes (Signed)
[facility] [place] [facility]   PATIENT NAME: Christina Macdonald    MR#:  [id number]  DATE OF BIRTH:  09-26-1924  SUBJECTIVE:  CHIEF COMPLAINT:   Chief Complaint  Patient presents with  . Hip Injury    left distal femur fx   patient is a 81 year old African-American female with history of dementia who presents to the hospital with complaints of complains of left leg pain for about 2 days. No fall or trauma reported. On arrival to emergency room, patient's x-ray revealed severely comminuted displaced and foreshortened fracture of distal third of femoral diaphysis, mildly impacted supracondylar fracture of distal femur with potential intra-articular extension, some lucencies over proximal to femoral diaphysis and intertrochanteric region, which was favored to be artifactual. CT scan of fundi femur was suggested. Patient was seen by Dr. medicines and did not recommend any further evaluation, but trial of nonoperative therapy was recommended. Patient denies any significant discomfort in her leg at present. She is receiving Tylenol for pain. Patient's labs revealed elevated liver enzymes, AST, as well as ALT 63 and 62 respectively. No elevated. Alkaline phosphatase was noted. Patient denies any abdominal pain, although her oral intake is poor  Review of Systems  Unable to perform ROS: dementia    VITAL SIGNS: Blood pressure 127/64, pulse 83, temperature 98.5 F (36.9 C), temperature source Oral, resp. rate 15, height 5\' 5"  (1.651 m), weight 62.738 kg (138 lb 5 oz), SpO2 98 %.  PHYSICAL EXAMINATION:   GENERAL:  82 y.o.-year-old patient lying in the bed with no acute distress. Comfortable in the bed EYES: Pupils equal, round, reactive to light and accommodation. No scleral icterus. Extraocular muscles intact.  HEENT: Head atraumatic, normocephalic. Oropharynx and nasopharynx clear.  NECK:  Supple, no jugular venous distention. No thyroid enlargement, no tenderness.   LUNGS: Normal breath sounds bilaterally, no wheezing, rales,rhonchi or crepitation. No use of accessory muscles of respiration.  CARDIOVASCULAR: S1, S2 normal. No murmurs, rubs, or gallops.  ABDOMEN: Soft, nontender, nondistended. Bowel sounds present. No organomegaly or mass.  EXTREMITIES: No pedal edema, cyanosis, or clubbing. Patient's left lower extremity is in the immobilizer NEUROLOGIC: Cranial nerves II through XII are intact. Muscle strength 5/5 in all 3 extremities, not checked, and left lower extremity. Sensation grossly intact. Gait not checked.  PSYCHIATRIC: The patient is alert but confused and she is does not know where she is and is not sure if she ate breakfast, I told her that her femur was broken and a few minutes later she does not remember that.  SKIN: No obvious rash, lesion, or ulcer.   ORDERS/RESULTS REVIEWED:   CBC  Recent Labs Lab 10/14/14 1600 10/15/14 0452  WBC 11.3* 10.0  HGB 10.8* 10.1*  HCT 34.2* 31.4*  PLT 358 327  MCV 87.9 87.0  MCH 27.9 27.9  MCHC 31.7* 32.1  RDW 14.4 14.3  LYMPHSABS 3.8*  --   MONOABS 0.6  --   EOSABS 0.1  --   BASOSABS 0.0  --    ------------------------------------------------------------------------------------------------------------------  Chemistries   Recent Labs Lab 10/14/14 1600 10/15/14 0452  NA 137 140  K 4.5 4.7  CL 102 107  CO2 28 28  GLUCOSE 186* 92  BUN 28* 23*  CREATININE 0.69 0.61  CALCIUM 8.8* 8.5*  AST 63*  --   ALT 62*  --   ALKPHOS 105  --   BILITOT 0.7  --    ------------------------------------------------------------------------------------------------------------------ estimated creatinine clearance is 44.6 mL/min (by C-G formula  based on Cr of 0.61). ------------------------------------------------------------------------------------------------------------------ No results for input(s): TSH, T4TOTAL, T3FREE, THYROIDAB in the last 72 hours.  Invalid input(s): FREET3  Cardiac  Enzymes No results for input(s): CKMB, TROPONINI, MYOGLOBIN in the last 168 hours.  Invalid input(s): CK ------------------------------------------------------------------------------------------------------------------ Invalid input(s): POCBNP ---------------------------------------------------------------------------------------------------------------  RADIOLOGY: Dg Chest 1 View  10/14/2014   CLINICAL DATA:  Leg pain after fall.  EXAM: CHEST  1 VIEW  COMPARISON:  None.  FINDINGS: The heart size and mediastinal contours are within normal limits. No pneumothorax or pleural effusion is noted. Severely elevated right hemidiaphragm is noted. Both lungs are clear. Degenerative change of right glenohumeral joint is noted.  IMPRESSION: Severely elevated right hemidiaphragm is noted concerning for diaphragmatic paralysis. No other abnormality seen.   Electronically Signed   By: Lupita Raider, M.D.   On: 10/14/2014 15:28   Dg Femur Min 2 Views Left  10/14/2014   CLINICAL DATA:  81 year old female with history of trauma from a fall today complaining of left leg pain distally.  EXAM: LEFT FEMUR 2 VIEWS  COMPARISON:  No priors.  FINDINGS: Severely comminuted displaced and foreshortened fracture of the distal third of the femoral diaphysis is noted. There is approximately 1/2 shaft width of posterior displacement, and several cm of proximal migration of the distal fracture fragment. In addition, there is a poorly demonstrated supracondylar fracture, which appears mildly impacted, and there is a suggestion that this may extend to the joint space in the intercondylar notch. Advanced degenerative changes of osteoarthritis are noted in the knee joint. Although no definite fracture of the proximal left femur is identified, there are some unusual lucencies overlying the proximal diaphysis and intertrochanteric region, which are favored to be related to overlying skin folds. Femoral head appears located.  IMPRESSION: 1.  Severely comminuted, displaced and foreshortened fracture of the distal third of the femoral diaphysis. 2. Mildly impacted supracondylar fracture of the distal femur, with potential intra-articular extension. 3. Unusual lucencies over the proximal femoral diaphysis and intertrochanteric region, favored to be artifactual. 4. Given the findings in numbers 1-3 above, further evaluation with CT of the entire left femur is suggested at this time to ensure that there is a full depiction of the extent of trauma to the left femur.   Electronically Signed   By: Trudie Reed M.D.   On: 10/14/2014 15:35    EKG:  Orders placed or performed during the hospital encounter of 10/14/14  . ED EKG  . ED EKG  . EKG 12-Lead  . EKG 12-Lead    ASSESSMENT AND PLAN:  Principal Problem:   Femur fracture, left Active Problems:   Elevated transaminase level   Hyperglycemia   Leukocytosis   Anemia   Diaphragmatic paralysis 1. Closed fracture of distal and left femur. Subsequent encounter , pain medications with Advil, not Tylenol discharge patient back to skilled nursing facility with follow-up with Dr. Sherrell Puller as outpatient. Patient may benefit from CT scan of femur as outpatient as recommended by radiologist. 2. Elevated transaminases. [place] of unclear etiology. Patient has no pains. He is to be investigated as outpatient with hepatitis panel. Do not use Tylenol for pain control . 3. Hyperglycemia. Get hemoglobin A1c to rule out diabetes mellitus 4. Leukocytosis, resolved with conservative therapy, likely stress related 5. Anemia. Follow with therapy, may need iron supplementation at home, get iron level 6. 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 Patient is relatively asymptomatic with oxygen saturations in the high 90s on room air  Management plans discussed with the patient, family and they are in agreement.   DRUG ALLERGIES:  Allergies  Allergen Reactions  . Diamox Sequels  [Acetazolamide Er] Other (See Comments)    Reaction:  Unknown     CODE STATUS:     Code Status Orders        Start     Ordered   10/14/14 1717  Do not attempt resuscitation (DNR)   Continuous    Question Answer Comment  In the event of cardiac or respiratory ARREST Do not call a "code blue"   In the event of cardiac or respiratory ARREST Do not perform Intubation, CPR, defibrillation or ACLS   In the event of cardiac or respiratory ARREST Use medication by any route, position, wound care, and other measures to relive pain and suffering. May use oxygen, suction and manual treatment of airway obstruction as needed for comfort.   Comments Nurse may pronounce      10/14/14 1717    Advance Directive Documentation        Most Recent Value   Type of Advance Directive  Out of facility DNR (pink MOST or yellow form)   Pre-existing out of facility DNR order (yellow form or pink MOST form)     "MOST" Form in Place?        TOTAL TIME TAKING CARE OF THIS PATIENT: 40 minutes.    Katharina Caper M.D on 10/15/2014 at 11:45 AM  Between 7am to 6pm - Pager - [phone]  After 6pm go to [email or web address] - password [email or web address] [facility]  [facility]  Office  [phone]  CC: Primary care physician; Derwood Kaplan, MD

## 2014-12-24 ENCOUNTER — Ambulatory Visit
Admission: RE | Admit: 2014-12-24 | Discharge: 2014-12-24 | Disposition: A | Discharge status: Home / Self Care | Payer: Medicare Other | Source: Ambulatory Visit | Attending: Internal Medicine | Admitting: Internal Medicine

## 2014-12-24 ENCOUNTER — Other Ambulatory Visit: Payer: Self-pay | Admitting: Internal Medicine

## 2014-12-24 DIAGNOSIS — R0902 Hypoxemia: Secondary | ICD-10-CM

## 2014-12-24 DIAGNOSIS — J9811 Atelectasis: Secondary | ICD-10-CM | POA: Diagnosis not present

## 2014-12-24 DIAGNOSIS — I2699 Other pulmonary embolism without acute cor pulmonale: Secondary | ICD-10-CM | POA: Diagnosis not present

## 2014-12-24 DIAGNOSIS — J986 Disorders of diaphragm: Secondary | ICD-10-CM | POA: Insufficient documentation

## 2014-12-24 LAB — POCT I-STAT CREATININE: Creatinine, Ser: 0.8 mg/dL (ref 0.44–1.00)

## 2014-12-24 MED ORDER — IOHEXOL 350 MG/ML SOLN
100.0000 mL | Freq: Once | INTRAVENOUS | Status: AC | PRN
  Administered 2014-12-24: 100 mL via INTRAVENOUS

## 2016-03-13 DIAGNOSIS — G301 Alzheimer's disease with late onset: Secondary | ICD-10-CM

## 2016-03-13 DIAGNOSIS — Z515 Encounter for palliative care: Secondary | ICD-10-CM | POA: Diagnosis not present

## 2016-03-13 DIAGNOSIS — J9610 Chronic respiratory failure, unspecified whether with hypoxia or hypercapnia: Secondary | ICD-10-CM

## 2016-03-13 DIAGNOSIS — E46 Unspecified protein-calorie malnutrition: Secondary | ICD-10-CM

## 2016-03-13 DIAGNOSIS — M4800 Spinal stenosis, site unspecified: Secondary | ICD-10-CM

## 2016-03-13 DIAGNOSIS — K219 Gastro-esophageal reflux disease without esophagitis: Secondary | ICD-10-CM | POA: Diagnosis not present

## 2016-03-13 DIAGNOSIS — F0280 Dementia in other diseases classified elsewhere without behavioral disturbance: Secondary | ICD-10-CM | POA: Diagnosis not present

## 2016-03-13 DIAGNOSIS — R4702 Dysphasia: Secondary | ICD-10-CM

## 2016-03-13 DIAGNOSIS — Z66 Do not resuscitate: Secondary | ICD-10-CM

## 2016-03-13 DIAGNOSIS — I739 Peripheral vascular disease, unspecified: Secondary | ICD-10-CM

## 2016-05-15 IMAGING — CR DG FEMUR 2+V*L*
1 series · 4 of 4 positions shown · non-contrast
Comparison: No priors.

CLINICAL DATA: 89-year-old female with history of trauma from a
fall today complaining of left leg pain distally.

EXAM:
LEFT FEMUR 2 VIEWS

[Series 1: dg femur min 2 views left · 0.14mm/px · 4 of 4 slices shown]
[im 1/4]
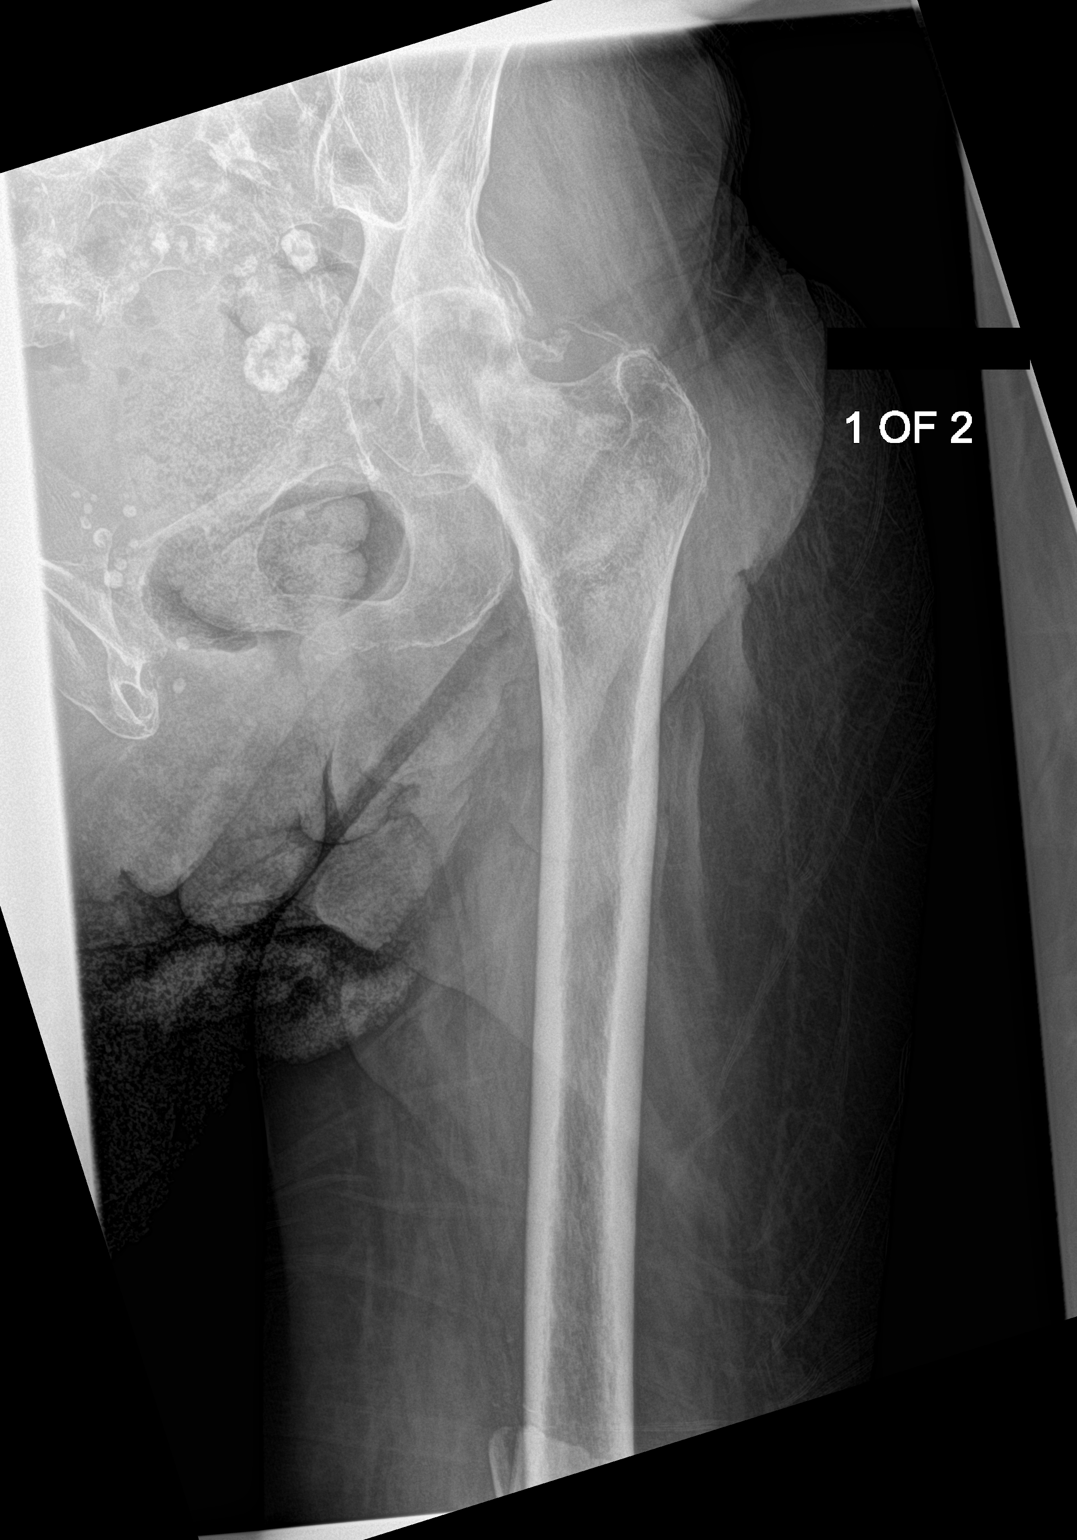
[im 2/4]
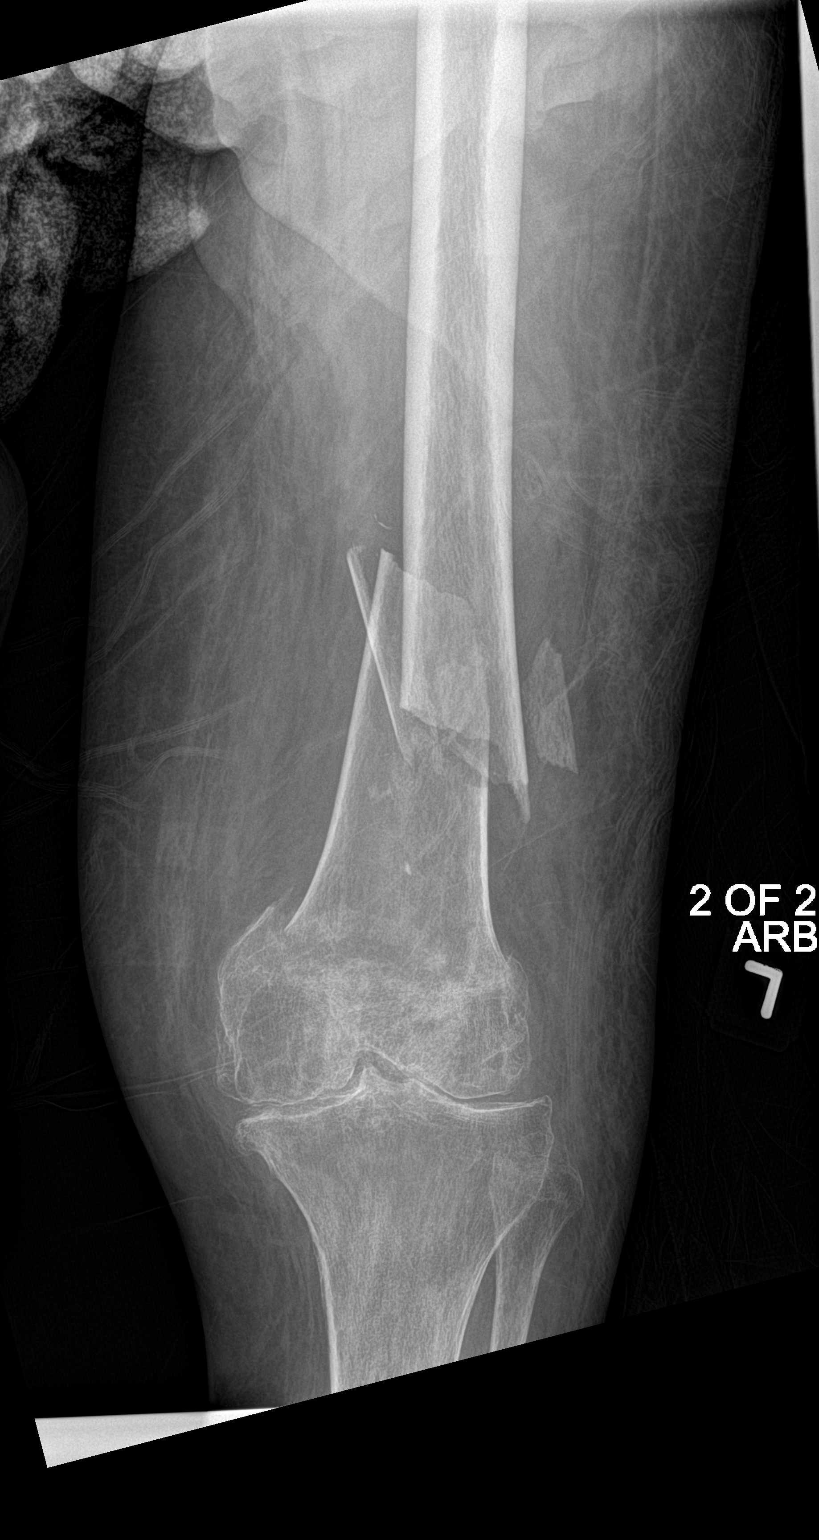
[im 3/4]
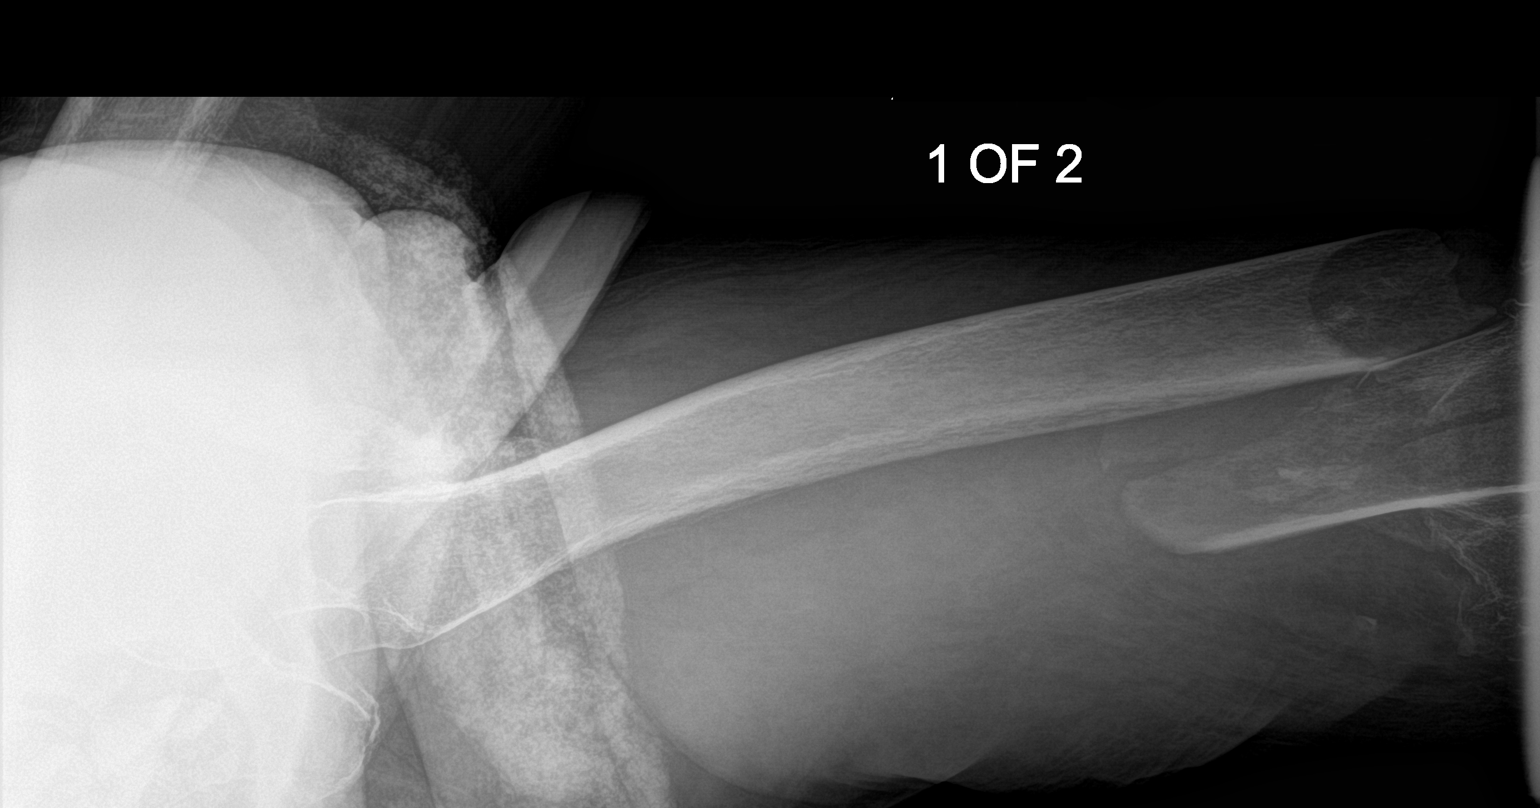
[im 4/4]
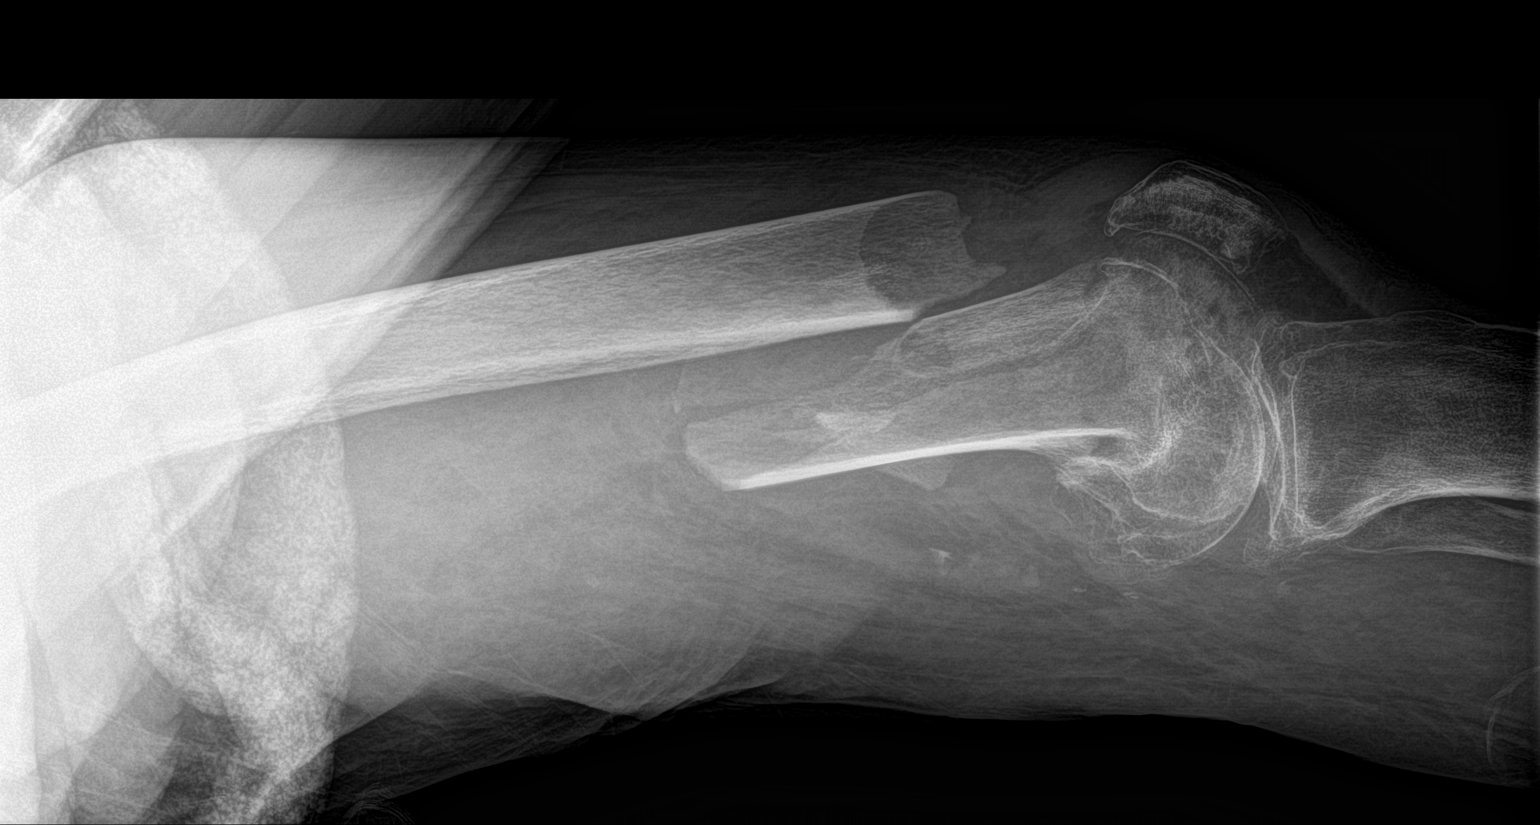

[4 of 4 positions shown; findings below may reference images not displayed]

FINDINGS: Severely comminuted displaced and foreshortened fracture of the
distal third of the femoral diaphysis is noted. There is
approximately [DATE] shaft width of posterior displacement, and several
cm of proximal migration of the distal fracture fragment. In
addition, there is a poorly demonstrated supracondylar fracture,
which appears mildly impacted, and there is a suggestion that this
may extend to the joint space in the intercondylar notch. Advanced
degenerative changes of osteoarthritis are noted in the knee joint.
Although no definite fracture of the proximal left femur is
identified, there are some unusual lucencies overlying the proximal
diaphysis and intertrochanteric region, which are favored to be
related to overlying skin folds. Femoral head appears located.
IMPRESSION: 1. Severely comminuted, displaced and foreshortened fracture of the
distal third of the femoral diaphysis.
2. Mildly impacted supracondylar fracture of the distal femur, with
potential intra-articular extension.
3. Unusual lucencies over the proximal femoral diaphysis and
intertrochanteric region, favored to be artifactual.
4. Given the findings in numbers 1-3 above, further evaluation with
CT of the entire left femur is suggested at this time to ensure that
there is a full depiction of the extent of trauma to the left femur.

## 2016-07-25 IMAGING — CT CT ANGIO CHEST
1 of 2 series · 18 of 30 positions shown · IV contrast (APPLIED)
Comparison: Chest x-ray 10/14/2014

CLINICAL DATA: Hypoxia.

EXAM:
CT ANGIOGRAPHY CHEST WITH CONTRAST
TECHNIQUE: Multidetector CT imaging of the chest was performed using the
standard protocol during bolus administration of intravenous
contrast. Multiplanar CT image reconstructions and MIPs were
obtained to evaluate the vascular anatomy.
CONTRAST:  100mL OMNIPAQUE IOHEXOL 350 MG/ML SOLN

[Series 6: pe 1.0 thins · axial · 0.68mm/px · z∈[-582,-352]mm · 18 of 260 slices shown]
[im 15/260  lung]
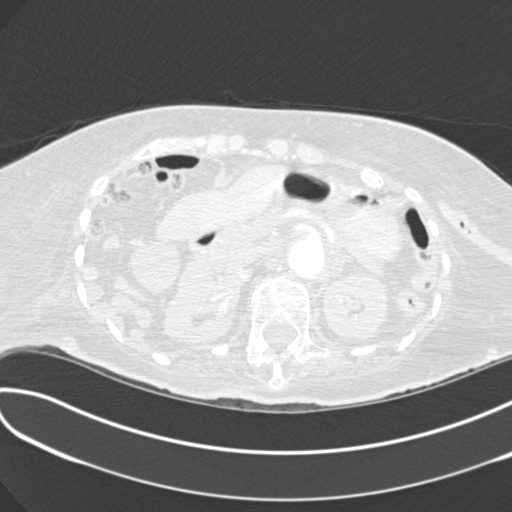
[im 29/260  mediastinal]
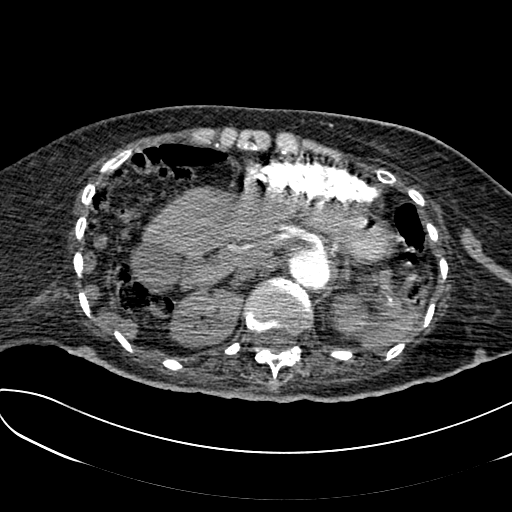
[im 44/260  lung]
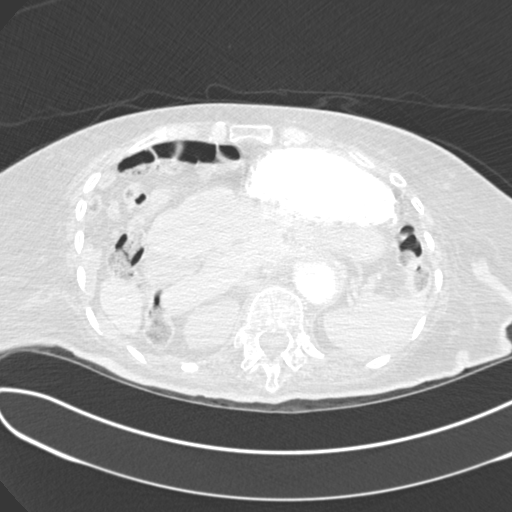
[im 58/260  mediastinal]
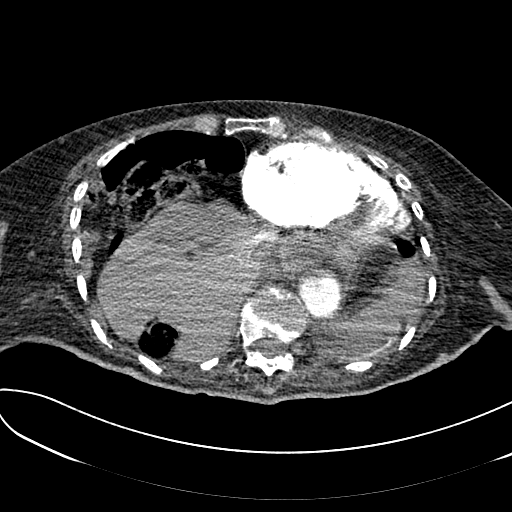
[im 72/260  lung]
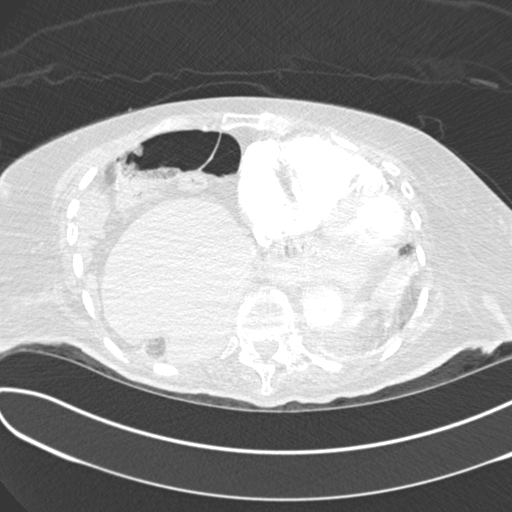
[im 87/260  mediastinal]
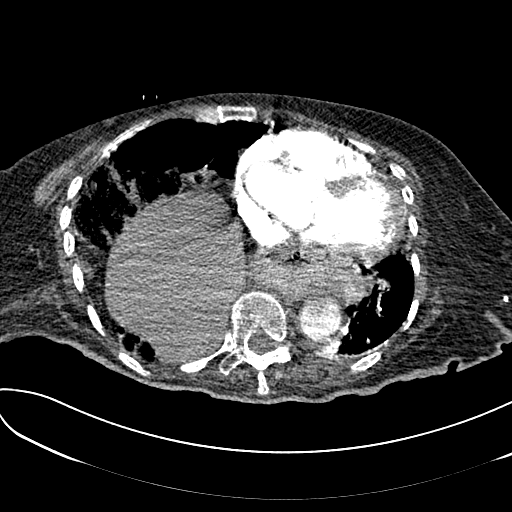
[im 101/260  lung]
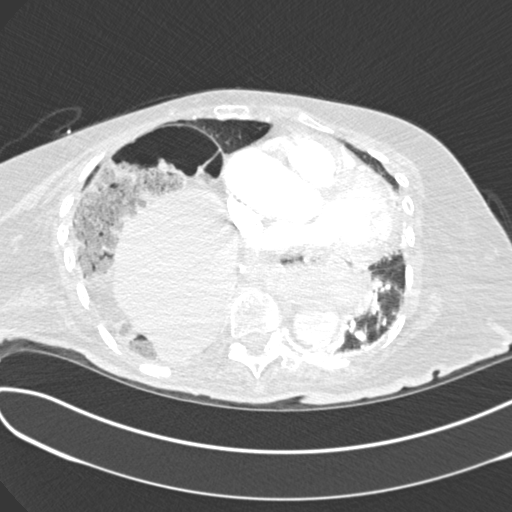
[im 116/260  mediastinal]
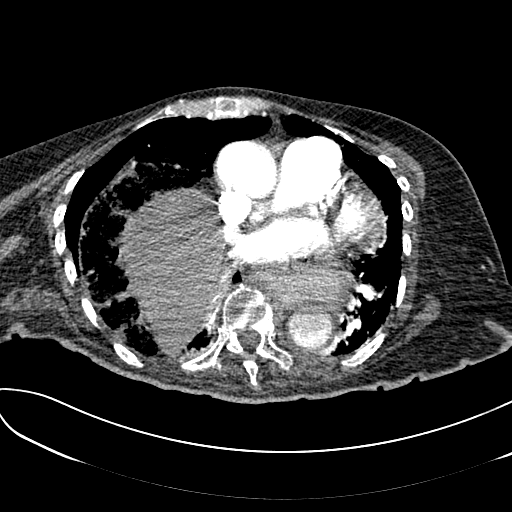
[im 121/260  lung]
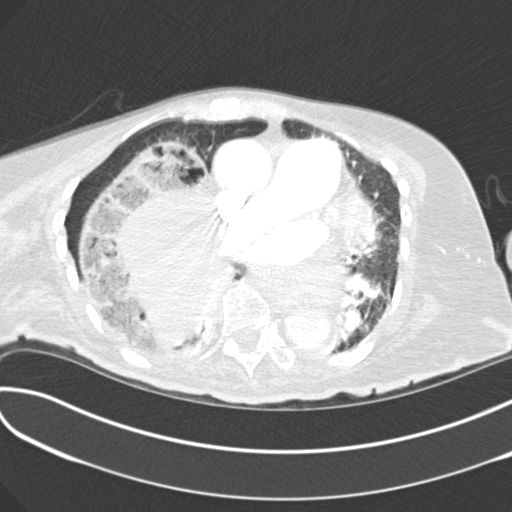
[im 130/260  mediastinal]
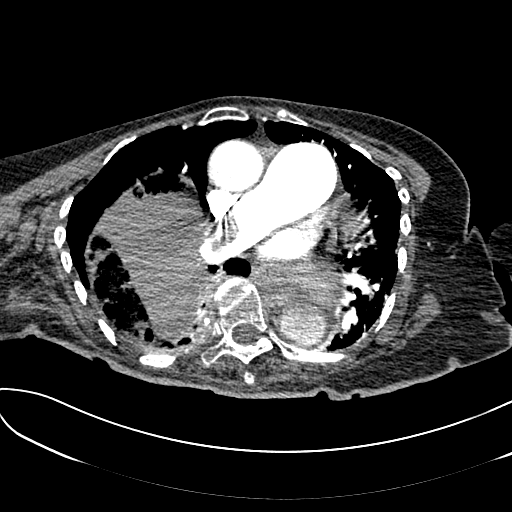
[im 144/260  lung]
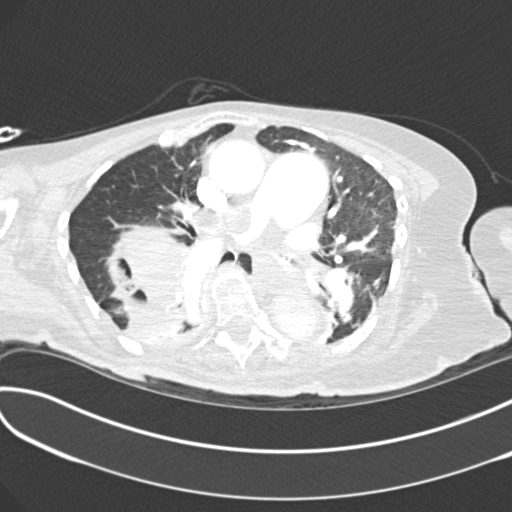
[im 159/260  mediastinal]
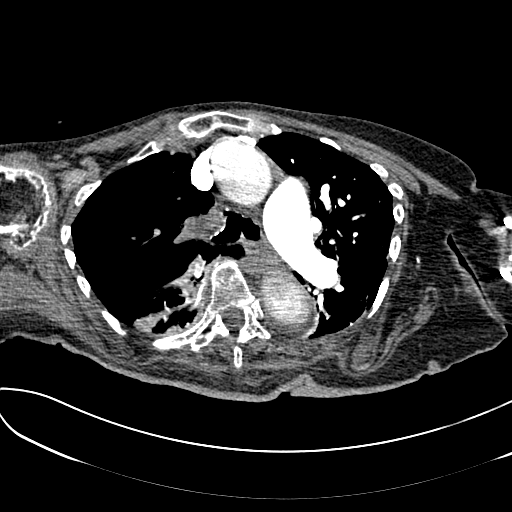
[im 173/260  lung]
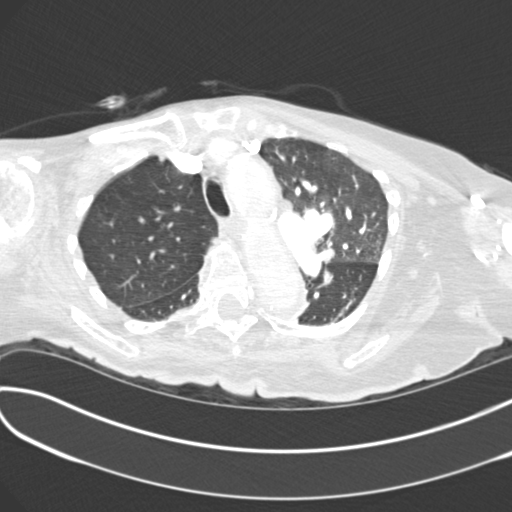
[im 188/260  mediastinal]
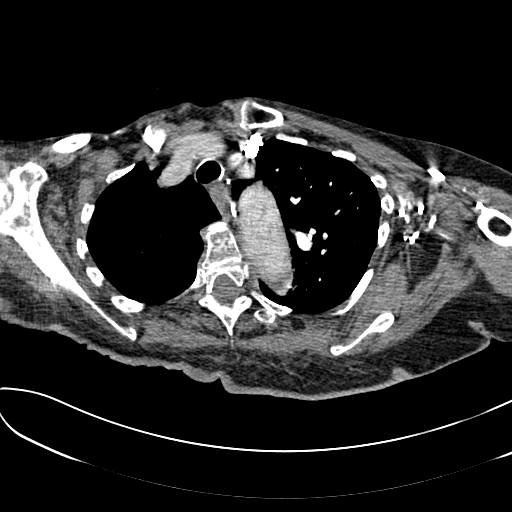
[im 202/260  lung]
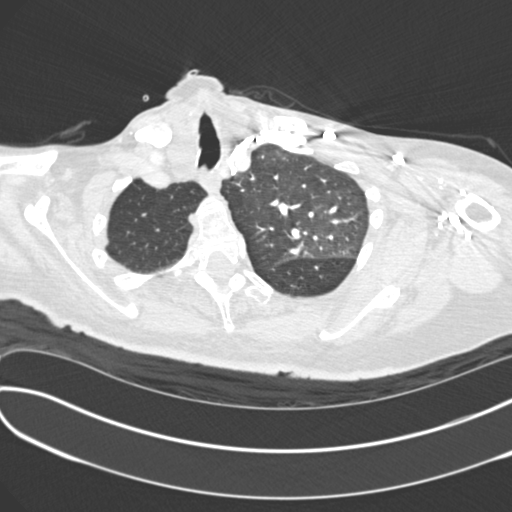
[im 216/260  mediastinal]
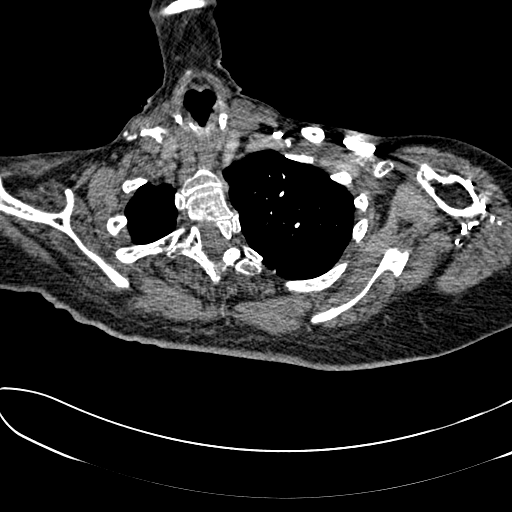
[im 231/260  lung]
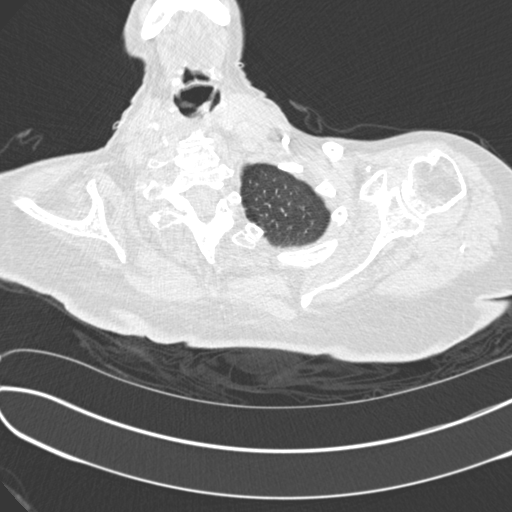
[im 245/260  mediastinal]
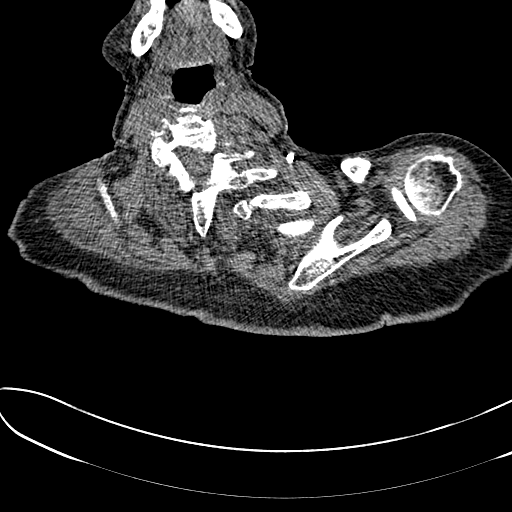

[18 of 30 positions shown; findings below may reference images not displayed]

FINDINGS: There is a large central pulmonary embolus in the right main
pulmonary artery, and extending into the right upper lobe pulmonary
artery and nonocclusive into the right lower lobe pulmonary artery.
No pulmonary embolus on the left. Markedly elevated right
hemidiaphragm as seen on prior chest x-ray. Heart is mildly
enlarged. Aorta is tortuous, non aneurysmal. Right lower lobe
atelectasis. No pleural effusions.

Chest wall soft tissues are unremarkable. No mediastinal, hilar, or
axillary adenopathy. Imaging into the upper abdomen shows no acute
findings.

Multiple compression fractures in the lower thoracic spine, most
pronounced at T11. These are age-indeterminate but most likely
chronic.

Review of the MIP images confirms the above findings.
IMPRESSION: Pulmonary embolus in the right pulmonary artery, extending and
occluding the right upper lobe pulmonary artery and nonocclusive in
the right lower lobe pulmonary artery.

Markedly elevated right hemidiaphragm with right base atelectasis.

Critical Value/emergent results were called by telephone at the time
of interpretation on 12/24/2014 at [DATE] to Dr. EUNSANG ALEXANDERSEN , who
verbally acknowledged these results.

## 2018-02-06 ENCOUNTER — Non-Acute Institutional Stay: Payer: Medicare Other | Admitting: Nurse Practitioner

## 2018-02-06 ENCOUNTER — Encounter: Payer: Self-pay | Admitting: Nurse Practitioner

## 2018-02-06 VITALS — BP 110/60 | HR 74 | Temp 97.0°F | Resp 18 | Ht 61.0 in | Wt 91.6 lb

## 2018-02-06 DIAGNOSIS — R54 Age-related physical debility: Secondary | ICD-10-CM | POA: Insufficient documentation

## 2018-02-06 DIAGNOSIS — R630 Anorexia: Secondary | ICD-10-CM

## 2018-02-06 DIAGNOSIS — G894 Chronic pain syndrome: Secondary | ICD-10-CM

## 2018-02-06 DIAGNOSIS — Z515 Encounter for palliative care: Secondary | ICD-10-CM

## 2018-02-06 DIAGNOSIS — R413 Other amnesia: Secondary | ICD-10-CM

## 2018-02-06 NOTE — Progress Notes (Signed)
[facility] Telephone: [phone] Fax: [phone]  PATIENT NAME: Christina Macdonald Persad DOB: 1924/05/21 MRN: [id number]  PRIMARY CARE PROVIDER:   Derwood KaplanEason, Ernest B, MD  REFERRING PROVIDER:  Derwood KaplanEason, Ernest B, MD [address] [place] [address]  RESPONSIBLE PARTY:   Neill LoftLinda Wade, daughter health care power-of-attorney [phone] or [phone]  ASSESSMENT:     I visited an observed Ms. Gregory. She did make eye contact with verbal cues. She did mumble some words that were in comprehensible. Limited verbal discussion due to cognitive impairment. Emotional support provided. Ms. Porfirio Mylarinnin smiled throughout palliative medicine visit. She did not appear to be in pain or distress. She appear comfortable though thin, chronically ill and debilitated secondary to progression of chronic disease of dementia. Medical goals have been to focus on comfort and at present time with slight weight loss of 3 pounds over the last four weeks with wound. Will monitor and follow may possibly require revisiting hospice screening if she continues to decline. I attempted to contact her daughter Bonita QuinLinda for update. No new changes to current goals are plan of care. At present time medical goals to continue to focus on comfort with DNR in place, do not hospitalized. I updated nursing staff. FAST 7C   PC 5 / 29 / 2015 to 10 / 26 / 2016 Hospice 10 / 28 / 2016 to 9 / 22 / 2017 PC 11 / 20 / 2017   9 / 13 / 2019 weight 97 lbs 10 / 17 / 2019 weight 94.0 lbs 11 / 17 / 2019 weight 91.6 lbs BMI 16.2  Pressure wound stage 2 right calf 13.9 cm x 7.1 cm x 2.5 cm treated with santyl  RECOMMENDATIONS and PLAN:   1.Anorexia R63.0 secondary to protein calorie malnutrition reflective of weight loss. Continue to monitor daily weights, supplements, supportive measures and courage to eat  2. Memory loss R41.3 secondary to dementia; appears progressive. Medical goals to continue to focus on Comfort, redirecting with  supportive measures.   3. Physical debility R54 secondary to dementia, Progressive continue to encourage staff to continue to turn for positioning q2hrs, monitor for wounds, high risk  4. Chronic pain G89.29 monitor on pain scale, monitor efficacy vs adverse side effects. Currently taking; continue on oxycodone;   5.Palliative care encounter Z51.5; Ongoing monitoring chronic disease management, symptoms, goc with emotional support. She is a DNR/DNH, with medical goals to focus on Comfort. Wishes are for treat what is treatable in place including, antibiotics, diagnostic testing, labs but no feeding tube, no transfusion, no hospitalization, no intubation, new surgical intervention. Palliative Care was asked to help address goals of care.    I spent 60 minutes providing this consultation,  from 11:15am to 12:15am. More than 50% of the time in this consultation was spent coordinating communication.   HISTORY OF PRESENT ILLNESS:  Christina Macdonald Vary is a 84 y.o. year old female with multiple medical problems including late onset Alzheimer's dementia, dysphasia, COPD, history of pulmonary embolism, chronic respiratory failure, gerd, spinal stenosis, peripheral vascular disease, anemia, osteoporosis,  seizure disorder, protein calorie malnutrition, chronic right diaphragmatic paralysis for chest x-ray. She resides at skilled long-term care facility, actually quadriplegic, total ADL dependence with incontinence bowel and bladder. She does require to be fed and appetite continues to be fair to poor with ongoing weight loss. She does receive oxycodone 5mg  tid with relief for chronic pain. Last primary provider visit 11/18 / 2019 for protein calorie malnutrition as she does continue to  consume less than 50% of her meals. She is cognitively impaired and not able to verbalize her needs but at times able to say some words clearly. She is a DNR/DNH, with medical goals to focus on Comfort. Wishes are for treat what is  treatable in place including , antibiotics, diagnostic testing, labs but no feeding tube, no transfusion, no hospitalization, no intubation, new surgical intervention. No recent hospitalizations, wounds, infections. At present she is lying in bed, appears comfortable, chronically ill and debilitated. No visitors present.  CODE STATUS: DNR  PPS: 30%  HOSPICE ELIGIBILITY/DIAGNOSIS: TBD   PAST MEDICAL HISTORY:  Past Medical History:  Diagnosis Date  . Dementia   . GERD (gastroesophageal reflux disease)   . Wounds, multiple open, lower extremity     SOCIAL HX:  Social History   Tobacco Use  . Smoking status: Never Smoker  Substance Use Topics  . Alcohol use: No    ALLERGIES:  Allergies  Allergen Reactions  . Diamox Sequels [Acetazolamide Er] Other (See Comments)    Reaction:  Unknown      PERTINENT MEDICATIONS:  Outpatient Encounter Medications as of 02/06/2018  Medication Sig  . cholecalciferol (VITAMIN D) 1000 UNITS tablet Take 1,000 Units by mouth daily.  . divalproex (DEPAKOTE SPRINKLE) 125 MG capsule Take 125 mg by mouth at bedtime.  . docusate sodium (COLACE) 100 MG capsule Take 100 mg by mouth 2 (two) times daily.  Marland Kitchen gabapentin (NEURONTIN) 300 MG capsule Take 300 mg by mouth at bedtime.  . hydrophilic ointment Apply 1 application topically every evening. Pt applies to feet.  Marland Kitchen ibuprofen (ADVIL) 200 MG tablet Take 1 tablet (200 mg total) by mouth every 6 (six) hours as needed.  Marland Kitchen ipratropium-albuterol (DUONEB) 0.5-2.5 (3) MG/3ML SOLN Take 3 mLs by nebulization 4 (four) times daily.  Marland Kitchen omeprazole (PRILOSEC) 20 MG capsule Take 20 mg by mouth daily.  Marland Kitchen oxyCODONE (OXY IR/ROXICODONE) 5 MG immediate release tablet Take 5 mg by mouth every 4 (four) hours as needed for severe pain.  . polyethylene glycol (MIRALAX / GLYCOLAX) packet Take 17 g by mouth daily.   No facility-administered encounter medications on file as of 02/06/2018.     PHYSICAL EXAM:   General: NAD, frail  appearing, thin, cognitively impaired female Cardiovascular: regular rate and rhythm Pulmonary: clear ant fields Abdomen: soft, nontender, + bowel sounds Extremities: +poor muscle tone, muscle wasting Skin: no rashes; Pressure wound stage 2 right calf 13.9 cm x 7.1 cm x 2.5 cm  Neurological: functional quadriplegic; generalized weakness  Raelin Pixler [place], NP

## 2018-03-05 DEATH — deceased
